# Patient Record
Sex: Female | Born: 1935 | Race: White | Marital: Married | Smoking: Never smoker
Health system: Southern US, Community
[De-identification: ages and names within clinical notes are randomized; demographics above are authoritative.]

## PROBLEM LIST (undated history)

## (undated) DIAGNOSIS — F0280 Dementia in other diseases classified elsewhere without behavioral disturbance: Secondary | ICD-10-CM

## (undated) DIAGNOSIS — F0391 Unspecified dementia with behavioral disturbance: Secondary | ICD-10-CM

## (undated) DIAGNOSIS — E119 Type 2 diabetes mellitus without complications: Secondary | ICD-10-CM

## (undated) DIAGNOSIS — G3183 Dementia with Lewy bodies: Secondary | ICD-10-CM

## (undated) DIAGNOSIS — F419 Anxiety disorder, unspecified: Secondary | ICD-10-CM

## (undated) DIAGNOSIS — I10 Essential (primary) hypertension: Secondary | ICD-10-CM

## (undated) DIAGNOSIS — F03918 Unspecified dementia, unspecified severity, with other behavioral disturbance: Secondary | ICD-10-CM

## (undated) HISTORY — DX: Dementia with Lewy bodies: G31.83

## (undated) HISTORY — DX: Unspecified dementia, unspecified severity, with other behavioral disturbance: F03.918

## (undated) HISTORY — PX: BACK SURGERY: SHX140

## (undated) HISTORY — DX: Anxiety disorder, unspecified: F41.9

## (undated) HISTORY — DX: Dementia in other diseases classified elsewhere, unspecified severity, without behavioral disturbance, psychotic disturbance, mood disturbance, and anxiety: F02.80

## (undated) HISTORY — DX: Type 2 diabetes mellitus without complications: E11.9

## (undated) HISTORY — DX: Essential (primary) hypertension: I10

## (undated) HISTORY — DX: Unspecified dementia with behavioral disturbance: F03.91

---

## 2012-12-29 ENCOUNTER — Other Ambulatory Visit: Payer: Self-pay | Admitting: Neurology

## 2012-12-29 DIAGNOSIS — F0391 Unspecified dementia with behavioral disturbance: Secondary | ICD-10-CM

## 2013-01-08 ENCOUNTER — Ambulatory Visit
Admission: RE | Admit: 2013-01-08 | Discharge: 2013-01-08 | Disposition: A | Discharge status: Home / Self Care | Payer: Self-pay | Source: Ambulatory Visit | Attending: Neurology | Admitting: Neurology

## 2013-01-08 DIAGNOSIS — F0391 Unspecified dementia with behavioral disturbance: Secondary | ICD-10-CM

## 2013-01-08 DIAGNOSIS — F0390 Unspecified dementia without behavioral disturbance: Secondary | ICD-10-CM

## 2013-01-12 ENCOUNTER — Telehealth: Payer: Self-pay | Admitting: *Deleted

## 2013-01-18 ENCOUNTER — Telehealth: Payer: Self-pay | Admitting: Neurology

## 2013-01-18 NOTE — Telephone Encounter (Signed)
I talked to the patient's husband and then the patient's daughter about recent test results. I explained EEG findings which may indicate propensity toward seizures. I also explained the MRI brain findings. Her FTA-abs was negative. She is very sleepy during the day and her husband is having a hard time attending to her. He also injured his back while lifting something at [facility]. I talked to the daughter and explained that for now I would like for her to take the Depakote 250 mg only at night. She was taking it in the morning months. I also want to eliminate the daytime dose of Risperdal and have her take 1.5 mg at night only. She has an appointment next week for a neuropsychological evaluation. The family has talked about the patient and her husband moving into [facility] in the assisted living portion. I suggested that they give [place] an update next week or in the next 2 weeks at which point we may decide to increase the Depakote to 500 mg at night. The patient's husband and the patient's daughter were in agreement with the plan.

## 2013-01-19 NOTE — Telephone Encounter (Signed)
Contact completed

## 2013-02-08 ENCOUNTER — Encounter: Payer: Self-pay | Admitting: *Deleted

## 2013-02-09 DIAGNOSIS — F03918 Unspecified dementia, unspecified severity, with other behavioral disturbance: Secondary | ICD-10-CM

## 2013-02-09 DIAGNOSIS — F0391 Unspecified dementia with behavioral disturbance: Secondary | ICD-10-CM

## 2013-03-30 ENCOUNTER — Ambulatory Visit: Payer: Self-pay | Admitting: Neurology

## 2013-04-15 ENCOUNTER — Encounter: Payer: Self-pay | Admitting: Neurology

## 2013-04-15 ENCOUNTER — Ambulatory Visit (INDEPENDENT_AMBULATORY_CARE_PROVIDER_SITE_OTHER): Payer: Medicare Other | Admitting: Neurology

## 2013-04-15 VITALS — BP 100/59 | HR 88 | Temp 97.2°F | Ht 60.0 in | Wt 145.0 lb

## 2013-04-15 DIAGNOSIS — F03918 Unspecified dementia, unspecified severity, with other behavioral disturbance: Secondary | ICD-10-CM

## 2013-04-15 DIAGNOSIS — F0391 Unspecified dementia with behavioral disturbance: Secondary | ICD-10-CM

## 2013-04-15 DIAGNOSIS — R269 Unspecified abnormalities of gait and mobility: Secondary | ICD-10-CM

## 2013-04-15 HISTORY — DX: Unspecified dementia, unspecified severity, with other behavioral disturbance: F03.918

## 2013-04-15 HISTORY — DX: Unspecified dementia with behavioral disturbance: F03.91

## 2013-04-15 MED ORDER — RISPERIDONE 1 MG PO TABS: 1.5000 mg | ORAL_TABLET | Freq: Every day | ORAL | Status: DC

## 2013-04-15 MED ORDER — DIVALPROEX SODIUM 250 MG PO DR TAB: 500.0000 mg | DELAYED_RELEASE_TABLET | Freq: Every day | ORAL | Status: DC

## 2013-04-15 MED ORDER — DONEPEZIL HCL 10 MG PO TABS: 10.0000 mg | ORAL_TABLET | Freq: Every day | ORAL | Status: DC

## 2013-04-15 NOTE — Progress Notes (Signed)
Subjective:    Patient ID: Monica [name] is a 79 y.o. female.  HPI   Interim history:   Monica [name] is a very pleasant 79 year old right-handed woman who presents for followup consultation of her memory loss with behavior. She's accompanied by her husband and daughter again today. I first met her on 12/28/2012, and which time she was on Aricept with an underlying history of hypertension and mood disturbance. Dermatologic has been ongoing for about 2 years or longer. She has been sleeping a lot during the day. She has had behavioral problems and was hospitalized for those and started on Aricept, Risperdal and Depakote about a year ago while still residing in [place]. She moved from [place] in August 2013. There is no family history of dementia. There is a family history of mood disturbance. At the time of her first visit I felt that them may be underlying loop monitor dementia. I continued her on Aricept and suggested an EEG as well as MRI brain and laboratory testing including B12, RPR, hemoglobin A1c, vitamin D and neuropsychological consultation. Her EEG from 01/04/2013 showed abnormal findings demonstrating moderate diffuse slowing, intermittent triphasic waves and occasional left frontal sharp waves consistent with moderate encephalopathy which can be seen in toxic, metabolic and neuro degenerative processes. She had a positive RPR but FTA abs was neg.  MRI brain from 01/08/13 showed: mild changes of chronic microvascular ischemia and generalized cerebral atrophy.The study is limited by motion artefacts. Vitamin B12 was elevated and she was advised to stop vitamin B12 and her vitamin D was slightly low at 23 and her husband was advised to over-the-counter vitamin D supplement to her regimen. Her daughter reports that she still is very sleepy during the day. She has had no further behavioral escalations. No significant changes reported since last time. She takes Risperdal 1.5 mg at night, Depakote 500 mg  at night, Aricept 10 mg at night. She does not take any medication during the day.  Her Past Medical History Is Significant For: Past Medical History  Diagnosis Date  . Dementia with Lewy bodies   . Dementia, unspecified, with behavioral disturbance   . Diabetes   . HTN (hypertension)   . Anxiety     Her Past Surgical History Is Significant For: No past surgical history on file.  Her Family History Is Significant For: Family History  Problem Relation Age of Onset  . Family history unknown: Yes    Her Social History Is Significant For: History   Social History  . Marital Status: Married    Spouse Name: N/A    Number of Children: N/A  . Years of Education: N/A   Social History Main Topics  . Smoking status: Never Smoker   . Smokeless tobacco: None  . Alcohol Use: No  . Drug Use: No  . Sexually Active: None   Other Topics Concern  . None   Social History Narrative  . None    Her Allergies Are:  Allergies  Allergen Reactions  . Asa (Aspirin)   :   Her Current Medications Are:  Outpatient Encounter Prescriptions as of 04/15/2013  Medication Sig Dispense Refill  . divalproex (DEPAKOTE) 250 MG DR tablet Take 250 mg by mouth at bedtime.       . donepezil (ARICEPT) 10 MG tablet Take 10 mg by mouth at bedtime.      . Multiple Vitamins-Minerals (MULTIVITAMIN PO) Take 1 tablet by mouth daily.      . risperiDONE (RISPERDAL) 1 MG tablet  Take 1.5 mg by mouth at bedtime.       Marland Kitchen VITAMIN D, CHOLECALCIFEROL, PO Take 1 tablet by mouth daily.      . [DISCONTINUED] temazepam (RESTORIL) 15 MG capsule Take 15 mg by mouth at bedtime as needed for sleep.      . [DISCONTINUED] vitamin B-12 (CYANOCOBALAMIN) 500 MCG tablet Take 500 mcg by mouth daily.       No facility-administered encounter medications on file as of 04/15/2013.  : Review of Systems  Constitutional: Positive for fatigue.  Gastrointestinal:       Incontinence  Neurological: Positive for weakness.       Memory  loss, restless leg  Psychiatric/Behavioral: Positive for confusion and dysphoric mood. The patient is nervous/anxious.        Too much sleep    Objective:  Neurologic Exam  Physical Exam Physical Examination:   Filed Vitals:   04/15/13 1209  BP: 100/59  Pulse: 88  Temp: 97.2 F (36.2 C)    General Examination: The patient is a very pleasant 79 y.o. female in no acute distress. She appears well-developed and well-nourished and adequately groomed.   On general exam: She is a frail-appearing elderly lady in no acute distress. She is minimally verbal and not able to provide any history. She denies active hallucinations. HEENT exam: Normocephalic, atraumatic, pupils are equal, round and reactive to light and accommodation. Extraocular tracking is abnormal with saccadic breakdown. She has minimal facial masking. She does not have any significant dysarthria but does have some stuttering and speaks with an accent. Oropharynx is clear with mucosal membranes mildly dry. Neck  tone  seems mildly elevated. She has no lip, neck or jaw tremor. Speech is very scant. Chest is clear to auscultation without wheezing, rhonchi or crackles. Heart sounds are normal without murmurs, rubs or gallops. Abdomen is soft, nontender with normal bowel sounds. She has no pitting edema in the distal lower extremities bilaterally. Skin is warm and dry. Neurologically: Mental status: The patient is awake but pace not very good attention. She is somewhat alert but not fully oriented. She's not answering any questions appropriately and has some confabulation and says she wants to go home. She does not seem to have any active hallucinations. Her Mini-Mental state exam was not repeated today and was about 12/25 in March. Cranial nerve exam is as described above. Motor exam: Thin bulk with fairly normal strength is noted. She has difficulty understanding multistep commands and is able to mimic. Her tone is increased with mild  cogwheeling noted on the right upper extremity only. She has no resting tremor. She has no postural or action tremor. Fine motor skills are impaired bilaterally in the mild to perhaps moderate degree. Reflexes are 1+ throughout. Sensory exam is difficult to tell but seems to be intact to light touch and pinprick. Finger to nose testing shows no additional dysmetria or intention tremor. She has no truncal or gait ataxia but she does walk with a shortened stride length and decrease pace and decreased arm swing bilaterally with a mild to moderate stoop. She walks more insecurely today.   Assessment and Plan:    In summary, Ms. Raphael is a 79 year old lady withA approximately 2-3 year, perhaps 4 year history of progressive dementia with behavioral changes and associated with hallucinations. Her history and physical exam are concerning for dementia with Lewy bodies. Her serum RPR was reactive but FTA Abs was neg. I talked to her family at length about dementia with  behavioral disturbance its prognosis and treatment options.  diagnostically we can consider CSF evaluation especially to check on VDRL. They are in the process of moving to [facility] on August 1 and her husband is scheduled for lower back surgery on July 30. I suggested that we keep her medications the same but consider tapering down her Risperdal and Depakote to reduce sedating medications as much is possible. In light of her upcoming move which can give her a set back because of change of environment I would not like to rock the boat today. We will consider doing a spinal tap down the Road and discuss this again at the next visit.  I will see her back at the end of August. They were in agreement.

## 2013-04-15 NOTE — Patient Instructions (Signed)
I do want to suggest a few things today:  Remember to drink plenty of fluid, eat healthy meals and do not skip any meals. Try to eat protein with a every meal and eat a healthy snack such as fruit or nuts in between meals. Try to keep a regular sleep-wake schedule and try to exercise daily, particularly in the form of walking, 20-30 minutes a day, if you can.   Engage in social activities in your community and with your family and try to keep up with current events by reading the newspaper or watching the news.   As far as your medications are concerned, I would like to suggest no changes, but we may reduce risperdal and depakote next time.    As far as diagnostic testing: we will do blood work today. We may do a spinal tap for spinal fluid testing down the road.   I would like to see you back in 3 months, sooner if we need to. Please call us with any interim questions, concerns, problems, updates or refill requests.  Please also call us for any test results so we can go over those with you on the phone. Brett Canales is my clinical assistant and will answer any of your questions and relay your messages to me and also relay most of my messages to you.  Our phone number is [phone]. We also have an after hours call service for urgent matters and there is a physician on-call for urgent questions. For any emergencies you know to call 911 or go to the nearest emergency room.

## 2013-04-16 ENCOUNTER — Telehealth: Payer: Self-pay

## 2013-04-16 LAB — COMPREHENSIVE METABOLIC PANEL
Albumin: 4.1 g/dL (ref 3.5–4.7)
Alkaline Phosphatase: 76 IU/L (ref 39–117)
BUN/Creatinine Ratio: 14 (ref 11–26)
BUN: 13 mg/dL (ref 8–27)
CO2: 23 mmol/L (ref 18–29)
Calcium: 9.3 mg/dL (ref 8.6–10.2)
Chloride: 104 mmol/L (ref 97–108)
Glucose: 89 mg/dL (ref 65–99)

## 2013-04-16 LAB — CBC WITH DIFFERENTIAL
Eos: 1 % (ref 0–5)
Immature Grans (Abs): 0 10*3/uL (ref 0.0–0.1)
Immature Granulocytes: 0 % (ref 0–2)
Lymphocytes Absolute: 2 10*3/uL (ref 0.7–3.1)
MCV: 93 fL (ref 79–97)
Monocytes: 9 % (ref 4–12)
Neutrophils Relative %: 62 % (ref 40–74)
Platelets: 155 10*3/uL (ref 150–379)
RBC: 4.75 x10E6/uL (ref 3.77–5.28)
WBC: 7 10*3/uL (ref 3.4–10.8)

## 2013-04-16 LAB — AMMONIA: Ammonia: 40 ug/dL (ref 19–87)

## 2013-04-16 NOTE — Telephone Encounter (Signed)
I called and notified patient's husband that patients lab results were normal. He thanked me for the information.

## 2013-04-16 NOTE — Progress Notes (Signed)
Quick Note:  Please notify daughter or husband that patient's labs were unremarkable. ______

## 2013-05-28 ENCOUNTER — Telehealth: Payer: Self-pay | Admitting: Neurology

## 2013-05-28 DIAGNOSIS — F0280 Dementia in other diseases classified elsewhere without behavioral disturbance: Secondary | ICD-10-CM

## 2013-05-31 MED ORDER — QUETIAPINE FUMARATE 25 MG PO TABS: ORAL_TABLET | ORAL | Status: DC

## 2013-05-31 NOTE — Telephone Encounter (Signed)
Daughter says last week patient seemed to be more confused, gait problems increased (unable to find her step), was very tired, and drooling more. Daughter says she stopped giving patient Risperdal this weekend and patient seems to be doing much better. She is requesting an alternative med or milder med to help patient with behavior, especially in the daytime.

## 2013-05-31 NOTE — Telephone Encounter (Signed)
Please call patient's daughter and explained her that we did not make any changes to her mother's medication regimen as she was in the process of moving to assisted living. We did discuss decreasing any sedating medications down the Road. I'm okay with her stopping the Risperdal which was not fond of it in the first place. I would like to suggest a touch of Seroquel which is a quarter of a pill daily as needed during the day for agitation and behavioral problems. Seroquel is also in the family of antipsychotics just as Risperdal but seems to be better tolerated in the elderly. This is a 25 mg strength pill and I would like to go as low as 6.25 mg at this time. I understand it is cumbersome to give quarter pills at a time but I think we should start as low as possible. Let's try this until her next appointment which is less than a month from now.

## 2013-05-31 NOTE — Telephone Encounter (Signed)
Due to messages back and forth, I LM of below, so she knows what the plan is.  LMVM for her to call back also.

## 2013-06-01 NOTE — Telephone Encounter (Signed)
Please have daughter cut the dose of Depakote in half, i.e. 250 mg at night only. It is good that she is there to monitor the patient. We can try this until her next appointment and see if her sedation improves. Depakote is known to cause sedation.

## 2013-06-01 NOTE — Telephone Encounter (Signed)
Daughter, Alona Bene, came by the office and asked questions about her mom.  Since making the change of being off Risperdal, she has noted positive change, no drooling.  Has had agitation.  Does have drowsiness and questions the depakote DR 500mg  qhs dose.  Would this have something to do with the drowsiness.  She seems to wake and be more interactive at 6-7pm, then its time for Depakote again.  She will try the seroquel 1/4 tab and see how she fairs. Has f/u appt 06-29-13, her father is in hospital now and had or is going to have back surgery and then to to rehab.  Daughter is staying with pt at [facility] (independent living).  Please advise on depakote, thanks

## 2013-06-02 NOTE — Telephone Encounter (Signed)
I called and spoke to Monica [name] and I relayed the information to her and she will go ahead and see how she does.  Will keep [place] informed.

## 2013-06-29 ENCOUNTER — Encounter: Payer: Self-pay | Admitting: Neurology

## 2013-06-29 ENCOUNTER — Ambulatory Visit (INDEPENDENT_AMBULATORY_CARE_PROVIDER_SITE_OTHER): Payer: Medicare Other | Admitting: Neurology

## 2013-06-29 VITALS — BP 108/72 | HR 84 | Ht 60.0 in | Wt 143.0 lb

## 2013-06-29 DIAGNOSIS — R451 Restlessness and agitation: Secondary | ICD-10-CM

## 2013-06-29 DIAGNOSIS — F411 Generalized anxiety disorder: Secondary | ICD-10-CM

## 2013-06-29 DIAGNOSIS — R269 Unspecified abnormalities of gait and mobility: Secondary | ICD-10-CM

## 2013-06-29 DIAGNOSIS — R4589 Other symptoms and signs involving emotional state: Secondary | ICD-10-CM

## 2013-06-29 DIAGNOSIS — F0280 Dementia in other diseases classified elsewhere without behavioral disturbance: Secondary | ICD-10-CM

## 2013-06-29 MED ORDER — DIAZEPAM 5 MG PO TABS: 2.5000 mg | ORAL_TABLET | Freq: Every day | ORAL | Status: DC | PRN

## 2013-06-29 NOTE — Progress Notes (Signed)
Subjective:    Patient ID: Monica [name] is a 79 y.o. female.  HPI  Interim history:   Monica [name] is a very pleasant 79 year old right-handed woman who presents for followup consultation of her memory loss with behavioral changes. She's accompanied by her son-in-law and daughter today. I last saw her on 04/15/2013, at which time I felt that she had a physical exam and history concerning for Lewy body dementia. Her husband indicated that they were in the process of moving to [facility] in the beginning of August and her husband was also scheduled for lower back surgery at the end of July. I suggested trying to gradually taper down her sedating medications including Risperdal and Depakote. In the interim, in 8/14, her daughter, Monica [name] called back a few times, reporting excessive daytime sedation and also agitation. We took the patient off of Risperdal and cut the Depakote in half. I also introduced very low dose Seroquel namely 6.25 mg which is a quarter of a pill to see if we can help the agitation. She has been very agitated on the lower dose of VPA and her main issues are agitation, restlessness. She is anxious.  Her daughter has taken her to live with them as patient's husband is in the hospital for the past month for an infection after is surgery.    I first met her on 12/28/2012, and which time she was on Aricept with an underlying history of hypertension and mood disturbance. The memory loss started over 2 years ago. She has been sleeping a lot during the day. She has had behavioral problems and hallucinations and was hospitalized for those and started on Aricept, Risperdal and Depakote about a year ago while still residing in [place]. She moved from [place] in August 2013. There is no family history of dementia. There is a family history of mood disturbance. At the time of her first visit I felt that them may be LBD. I continued her on Aricept and suggested an EEG as well as MRI brain and laboratory  testing including B12, RPR, hemoglobin A1c, vitamin D and neuropsychological consultation. Her EEG from 01/04/2013 showed abnormal findings demonstrating moderate diffuse slowing, intermittent triphasic waves and occasional left frontal sharp waves consistent with moderate encephalopathy which can be seen in toxic, metabolic and neuro degenerative processes. She had a positive RPR but FTA abs was neg.  MRI brain from 01/08/13 showed: This MRI shows mild changes of chronic microvascular ischemia and generalized cerebral atrophy.The study is limited by motion artefacts.  Her Past Medical History Is Significant For: Past Medical History  Diagnosis Date  . Dementia with Lewy bodies   . Dementia, unspecified, with behavioral disturbance   . Diabetes   . HTN (hypertension)   . Anxiety   . Dementia with behavioral disturbance 04/15/2013    Her Past Surgical History Is Significant For: No past surgical history on file.  Her Family History Is Significant For: No family history on file.  Her Social History Is Significant For: History   Social History  . Marital Status: Married    Spouse Name: N/A    Number of Children: N/A  . Years of Education: N/A   Social History Main Topics  . Smoking status: Never Smoker   . Smokeless tobacco: None  . Alcohol Use: No  . Drug Use: No  . Sexual Activity: None   Other Topics Concern  . None   Social History Narrative  . None    Her Allergies Are:  Allergies  Allergen Reactions  . Asa [Aspirin]   :   Her Current Medications Are:  Outpatient Encounter Prescriptions as of 06/29/2013  Medication Sig Dispense Refill  . divalproex (DEPAKOTE) 250 MG DR tablet Take 2 tablets (500 mg total) by mouth at bedtime.  60 tablet  5  . donepezil (ARICEPT) 10 MG tablet Take 1 tablet (10 mg total) by mouth at bedtime.  30 tablet  5  . Multiple Vitamins-Minerals (MULTIVITAMIN PO) Take 1 tablet by mouth daily.      . QUEtiapine (SEROQUEL) 25 MG tablet Take 1/4  pill daily during the day as needed for agitation.  15 tablet  2  . VITAMIN D, CHOLECALCIFEROL, PO Take 1 tablet by mouth daily.      . risperiDONE (RISPERDAL) 1 MG tablet Take 1.5 tablets (1.5 mg total) by mouth at bedtime.  45 tablet  5   No facility-administered encounter medications on file as of 06/29/2013.    Review of Systems  HENT: Positive for trouble swallowing.   Cardiovascular: Positive for palpitations.  Gastrointestinal: Positive for constipation.  Endocrine: Positive for heat intolerance and polydipsia.  Genitourinary: Positive for difficulty urinating.  Neurological: Positive for tremors and speech difficulty.       Memory loss  Psychiatric/Behavioral: Positive for confusion.    Objective:  Neurologic Exam  Physical Exam Physical Examination:   Filed Vitals:   06/29/13 1531  BP: 108/72  Pulse: 84   General Examination: The patient is a very pleasant 79 y.o. female in no acute distress. She appears well-developed and well-nourished and adequately groomed.   On general exam: She is a frail-appearing elderly lady in no acute distress. She is minimally verbal and not able to provide any history. She denies active hallucinations. HEENT exam: Normocephalic, atraumatic, pupils are equal, round and reactive to light and accommodation. Extraocular tracking is abnormal with saccadic breakdown. She has minimal facial masking. She does not have any significant dysarthria but does have some stuttering and speaks with an accent. Oropharynx is clear with mucosal membranes mildly dry. Neck  tone  seems mildly elevated. She has no lip, neck or jaw tremor. Speech is very scant. Chest is clear to auscultation without wheezing, rhonchi or crackles. Heart sounds are normal without murmurs, rubs or gallops. Abdomen is soft, nontender with normal bowel sounds. She has no pitting edema in the distal lower extremities bilaterally. Skin is warm and dry. Neurologically: Mental status: The patient is  awake but pays little attention. She is somewhat alert but not fully oriented. She's not answering any questions appropriately and has some confabulation and she has psychomotor agitation. She does not seem to have any active hallucinations. Her Mini-Mental state exam was not repeated today and was about 12/25 in March. Cranial nerve exam is as described above. Motor exam: Thin bulk with fairly normal strength is noted. She has difficulty understanding multistep commands and is able to mimic. Her tone is increased with mild cogwheeling noted on the right upper extremity only. She has no resting tremor. She has no postural or action tremor. Fine motor skills are impaired bilaterally in the mild to perhaps moderate degree. Reflexes are 1+ throughout. Sensory exam is difficult to tell but seems to be intact to light touch and pinprick. Finger to nose testing shows no additional dysmetria or intention tremor. She has no truncal or gait ataxia but she does walk with a shortened stride length and decrease pace and decreased arm swing bilaterally with a mild to moderate  stoop. She walks more securely today.  Assessment and Plan:   In summary, Monica [name] is a 79 year old lady with an approximately 2-3 year, perhaps 4 year history of progressive dementia with behavioral changes and associated with hallucinations, which were apparent in the beginning. She has lost control of bladder function. Her history and physical exam are concerning for dementia with Lewy bodies. Her serum RPR was reactive but FTA Abs was neg. I talked to her family again at length about dementia with behavioral disturbance its prognosis and treatment options; she has progressed rapidly. She moved to [facility] on August 1 and her husband has been in the hospital with complications of his back surgery. I suggested that we keep her on Depakote 500mg  QHS and that we keep her off of risperdal and increase Seroquel to 12.5 mg. For her anxiety I will  try her on a very small dose of Valium, 2.5 mg prn, but with the understanding, that she may have a paradoxical reaction to it and she is already very sleepy and I reminded her daughter that she could be more sleepy. I will see her back in 3 months. They were in agreement.

## 2013-06-29 NOTE — Patient Instructions (Signed)
Increase Seroquel to 1/2 pill each evening. Continue Depakote ER 500 mg each night and continue Aricept at the current dose. For anxiety and restlessness, let's try low dose valium.

## 2013-07-02 ENCOUNTER — Telehealth: Payer: Self-pay | Admitting: Neurology

## 2013-07-05 ENCOUNTER — Telehealth: Payer: Self-pay | Admitting: Neurology

## 2013-07-06 NOTE — Telephone Encounter (Signed)
Rx resubmitted

## 2013-07-06 NOTE — Telephone Encounter (Signed)
Jessica: Can you resubmit Valium prescription as I entered last time? Thanks. Sandy: Please assist with filling out requested form for memory care - please see below notes.

## 2013-07-07 ENCOUNTER — Emergency Department (HOSPITAL_COMMUNITY): Payer: Medicare Other

## 2013-07-07 ENCOUNTER — Emergency Department (HOSPITAL_COMMUNITY)
Admission: EM | Admit: 2013-07-07 | Discharge: 2013-07-07 | Discharge status: Against Medical Advice | Payer: Medicare Other | Attending: Emergency Medicine | Admitting: Emergency Medicine

## 2013-07-07 ENCOUNTER — Encounter (HOSPITAL_COMMUNITY): Payer: Self-pay | Admitting: Physical Medicine and Rehabilitation

## 2013-07-07 DIAGNOSIS — Y9229 Other specified public building as the place of occurrence of the external cause: Secondary | ICD-10-CM | POA: Insufficient documentation

## 2013-07-07 DIAGNOSIS — IMO0002 Reserved for concepts with insufficient information to code with codable children: Secondary | ICD-10-CM | POA: Insufficient documentation

## 2013-07-07 DIAGNOSIS — R296 Repeated falls: Secondary | ICD-10-CM | POA: Insufficient documentation

## 2013-07-07 DIAGNOSIS — Y939 Activity, unspecified: Secondary | ICD-10-CM | POA: Insufficient documentation

## 2013-07-07 LAB — CBC WITH DIFFERENTIAL/PLATELET
Basophils Absolute: 0 10*3/uL (ref 0.0–0.1)
Eosinophils Absolute: 0.1 10*3/uL (ref 0.0–0.7)
Eosinophils Relative: 1 % (ref 0–5)
MCH: 32.1 pg (ref 26.0–34.0)
MCV: 89.1 fL (ref 78.0–100.0)
Neutrophils Relative %: 56 % (ref 43–77)
Platelets: 182 10*3/uL (ref 150–400)
RDW: 13.3 % (ref 11.5–15.5)
WBC: 8.8 10*3/uL (ref 4.0–10.5)

## 2013-07-07 NOTE — ED Notes (Addendum)
Pt presents to department for evaluation of fall. States patient was in hospital visiting husband and fell on R side. Now states R sided rib/shoulder pain. Abrasion to R forehead. Pt has dementia and is anxious upon arrival to ED. She is alert and can answer simple questions. Respirations unlabored.

## 2013-07-07 NOTE — Telephone Encounter (Signed)
I called and LMVM for Monica [name] to return call re: filling out FML-2 form.  I also called and LMVM for her last evening.  07-06-13.

## 2013-07-07 NOTE — ED Notes (Signed)
Patient vary anxious at this time, patient with dementia.  Patient's daughter came to desk multiple times for a room.  Patient is conscious and alert at this time.

## 2013-07-07 NOTE — ED Notes (Signed)
Patient now complaining of chest pain and shortness of breath.  Patient put in for EKG and vitals done.  Bloodwork drawn.

## 2013-07-08 ENCOUNTER — Encounter (HOSPITAL_BASED_OUTPATIENT_CLINIC_OR_DEPARTMENT_OTHER): Payer: Self-pay

## 2013-07-08 ENCOUNTER — Emergency Department (HOSPITAL_BASED_OUTPATIENT_CLINIC_OR_DEPARTMENT_OTHER)
Admission: EM | Admit: 2013-07-08 | Discharge: 2013-07-08 | Disposition: A | Discharge status: Home / Self Care | Payer: Medicare Other | Attending: Emergency Medicine | Admitting: Emergency Medicine

## 2013-07-08 ENCOUNTER — Emergency Department (HOSPITAL_BASED_OUTPATIENT_CLINIC_OR_DEPARTMENT_OTHER): Payer: Medicare Other

## 2013-07-08 DIAGNOSIS — W1809XA Striking against other object with subsequent fall, initial encounter: Secondary | ICD-10-CM | POA: Insufficient documentation

## 2013-07-08 DIAGNOSIS — Z79899 Other long term (current) drug therapy: Secondary | ICD-10-CM | POA: Insufficient documentation

## 2013-07-08 DIAGNOSIS — F0390 Unspecified dementia without behavioral disturbance: Secondary | ICD-10-CM | POA: Insufficient documentation

## 2013-07-08 DIAGNOSIS — S20219A Contusion of unspecified front wall of thorax, initial encounter: Secondary | ICD-10-CM | POA: Insufficient documentation

## 2013-07-08 DIAGNOSIS — F411 Generalized anxiety disorder: Secondary | ICD-10-CM | POA: Insufficient documentation

## 2013-07-08 DIAGNOSIS — Y9210 Unspecified residential institution as the place of occurrence of the external cause: Secondary | ICD-10-CM | POA: Insufficient documentation

## 2013-07-08 DIAGNOSIS — Y9389 Activity, other specified: Secondary | ICD-10-CM | POA: Insufficient documentation

## 2013-07-08 DIAGNOSIS — E119 Type 2 diabetes mellitus without complications: Secondary | ICD-10-CM | POA: Insufficient documentation

## 2013-07-08 DIAGNOSIS — IMO0002 Reserved for concepts with insufficient information to code with codable children: Secondary | ICD-10-CM | POA: Insufficient documentation

## 2013-07-08 DIAGNOSIS — S20211A Contusion of right front wall of thorax, initial encounter: Secondary | ICD-10-CM

## 2013-07-08 DIAGNOSIS — I10 Essential (primary) hypertension: Secondary | ICD-10-CM | POA: Insufficient documentation

## 2013-07-08 LAB — POCT I-STAT TROPONIN I: Troponin i, poc: 0 ng/mL (ref 0.00–0.08)

## 2013-07-08 LAB — POCT I-STAT, CHEM 8
Calcium, Ion: 1.1 mmol/L — ABNORMAL LOW (ref 1.13–1.30)
Glucose, Bld: 98 mg/dL (ref 70–99)
HCT: 43 % (ref 36.0–46.0)
Hemoglobin: 14.6 g/dL (ref 12.0–15.0)
TCO2: 25 mmol/L (ref 0–100)

## 2013-07-08 MED ORDER — TRAMADOL HCL 50 MG PO TABS
ORAL_TABLET | ORAL | Status: AC
  Filled 2013-07-08: qty 1

## 2013-07-08 MED ORDER — TRAMADOL HCL 50 MG PO TABS: 50.0000 mg | ORAL_TABLET | Freq: Four times a day (QID) | ORAL | Status: DC | PRN

## 2013-07-08 MED ORDER — TRAMADOL HCL 50 MG PO TABS
50.0000 mg | ORAL_TABLET | Freq: Once | ORAL | Status: AC
  Administered 2013-07-08: 50 mg via ORAL
  Filled 2013-07-08: qty 1

## 2013-07-08 NOTE — ED Provider Notes (Addendum)
CSN: [id number]     Arrival date & time 07/08/13  1551 History   First MD Initiated Contact with Patient 07/08/13 1631     Chief Complaint  Patient presents with  . Fall   (Consider location/radiation/quality/duration/timing/severity/associated sxs/prior Treatment) HPI Comments: Patient presents with rib pain after fall. She was in the [facility] emergency department last night waiting for her daughter who lives with the patient's has been in the ED. While the patient was sitting in a wheelchair in the emergency department lobby her foot got caught in the wheelchair and she fell over onto the floor. She's been complaining of pain in her right ribs. She did hit her head and there was a small abrasion to the face. Her daughter thinks abrasions from where her glasses scratched her. Her daughter thinks that she is at her baseline mental status today. She's had no vomiting. She hasn't had any other apparent complaints of pain. She does have some underlying dementia so history is limited. She's been ambulating with no apparent difficulty with ambulation or pain in her legs. She was initially checked into the emergency department last night and they did an x-ray of her chest in her right shoulder. However there was a long wait in the ED and the patient was getting agitated so her daughter took her home before she was evaluated.  Patient is a 79 y.o. female presenting with fall.  Fall    Past Medical History  Diagnosis Date  . Dementia with Lewy bodies   . Dementia, unspecified, with behavioral disturbance   . Diabetes   . HTN (hypertension)   . Anxiety   . Dementia with behavioral disturbance 04/15/2013   Past Surgical History  Procedure Laterality Date  . Back surgery     No family history on file. History  Substance Use Topics  . Smoking status: Never Smoker   . Smokeless tobacco: Not on file  . Alcohol Use: No   OB History   Grav Para Term Preterm Abortions TAB SAB Ect Mult Living               Review of Systems  Unable to perform ROS: Dementia    Allergies  Asa  Home Medications   Current Outpatient Rx  Name  Route  Sig  Dispense  Refill  . diazepam (VALIUM) 5 MG tablet   Oral   Take 0.5 tablets (2.5 mg total) by mouth daily as needed for anxiety.   15 tablet   2   . divalproex (DEPAKOTE) 250 MG DR tablet   Oral   Take 2 tablets (500 mg total) by mouth at bedtime.   60 tablet   5   . donepezil (ARICEPT) 10 MG tablet   Oral   Take 1 tablet (10 mg total) by mouth at bedtime.   30 tablet   5   . Multiple Vitamins-Minerals (MULTIVITAMIN PO)   Oral   Take 1 tablet by mouth daily.         . QUEtiapine (SEROQUEL) 25 MG tablet      Take 1/4 pill daily during the day as needed for agitation.   15 tablet   2   . traMADol (ULTRAM) 50 MG tablet   Oral   Take 1 tablet (50 mg total) by mouth every 6 (six) hours as needed for pain.   15 tablet   0   . VITAMIN D, CHOLECALCIFEROL, PO   Oral   Take 1 tablet by mouth daily.  BP 114/80  Pulse 69  Temp(Src) 98.1 F (36.7 C) (Oral)  Resp 18  Ht 5\' 1"  (1.549 m)  Wt 144 lb (65.318 kg)  BMI 27.22 kg/m2  SpO2 97% Physical Exam  Constitutional: She appears well-developed and well-nourished.  HENT:  Head: Normocephalic.  Small abrasion just lateral to the right eyebrow. No underlying bony tenderness to the face.  Eyes: Pupils are equal, round, and reactive to light.  Neck: Normal range of motion. Neck supple.  No apparent tenderness to the cervical thoracic or lumbar sacral spine.  Cardiovascular: Normal rate, regular rhythm and normal heart sounds.   Pulmonary/Chest: Effort normal and breath sounds normal. No respiratory distress. She has no wheezes. She has no rales. She exhibits tenderness.  Positive tenderness to the right lower ribs. It's tender in the anterior and lateral aspect of the ribs. There is no crepitus or underlying deformity noted. No ecchymosis to the chest wall or the  abdominal wall is noted.  Abdominal: Soft. Bowel sounds are normal. There is no tenderness. There is no rebound and no guarding.  No pain around the liver the spleen.  Musculoskeletal: Normal range of motion. She exhibits no edema.  She has no pain on palpation or range of motion of the extremities, including the hips. There is a small area of ecchymosis adjacent to the right elbow but there is no underlying bony tenderness.  Lymphadenopathy:    She has no cervical adenopathy.  Neurological: She is alert.  Patient answers questions but is confused. She is at her baseline mental status per her daughter. She moves all extremities symmetrically.  Skin: Skin is warm and dry. No rash noted.  Psychiatric: She has a normal mood and affect.    ED Course  Procedures (including critical care time) Labs Review Labs Reviewed - No data to display Imaging Review Dg Chest 2 View  07/08/2013   CLINICAL DATA:  History of fall from wheelchair. Lateral chest wall pain.  EXAM: CHEST  2 VIEW  COMPARISON:  CHEST x-ray 07/07/2013.  FINDINGS: Lung volumes appear slightly low. Diffuse interstitial prominence throughout the periphery of the lungs bilaterally, most severe in the lower lungs, concerning for an underlying interstitial lung disease. No definite pleural effusions. No evidence of pulmonary edema. Potential bronchiectasis in the right lower lobe. Heart size is normal. Eventration of the right hemidiaphragm. Mediastinal contours are unremarkable. Atherosclerosis in the thoracic aorta. No definite acute displaced fractures of the visualized portions of the thorax. Surgical clips project over the right upper quadrant of the abdomen, likely from prior cholecystectomy.  IMPRESSION: 1. The appearance of the chest suggests underlying interstitial lung disease. This could be better evaluated with followup high-resolution Chest CT if clinically indicated. 2. No signs of significant acute traumatic injury to the thorax. 3.  Atherosclerosis.   Electronically Signed   By: Trudie Reed M.D.   On: 07/08/2013 17:51   Dg Chest 2 View  07/07/2013   CLINICAL DATA:  Right sided pain post fall.  EXAM: CHEST  2 VIEW  COMPARISON:  None.  FINDINGS: No pneumothorax or effusion. Coarse interstitial and airspace opacities in the basilar segments of both lower lobes. Coarse perihilar interstitial markings of uncertain chronicity. No definite effusion. Mild cardiomegaly. Tortuous thoracic aorta. Curvilinear right paratracheal density probably tortuous brachiocephalic vessels but nonspecific. Surgical clips in the right upper abdomen. Regional bones grossly unremarkable.  IMPRESSION: Mild cardiomegaly  Perihilar and bibasilar interstitial opacities of uncertain chronicity.  No definite acute abnormality.   Electronically  Signed   By: Oley Balm M.D.   On: 07/07/2013 20:05   Dg Shoulder Right  07/07/2013   CLINICAL DATA:  Right chest pain post fall  EXAM: RIGHT SHOULDER - 2+ VIEW  COMPARISON:  None.  FINDINGS: Mild diffuse osteopenia. Small marginal spurs from the humeral head. Negative for fracture or dislocation.  IMPRESSION: No fracture or other acute abnormality.  Osteopenia and early degenerative spurring   Electronically Signed   By: Oley Balm M.D.   On: 07/07/2013 20:06    MDM   1. Rib contusion, right, initial encounter    Patient had no evidence of pneumothorax. I did repeat a chest x-ray today because the daughter feels like the patient has been painting and has been having more shortness of breath today. In the room she has no evidence of increased work of breathing. There is no evidence of pneumothorax. There is a likely rib fracture versus contusion. There's no associated abdominal tenderness. She does have a small abrasion on the forehead. There is no hematomas. I discussed with the daughter doing a head CT given her age and difficulty with assessment given her dementia. At this point the daughter does not want to  do this and will observe her at home for any changes. She was given a dose of Ultram here in the ED and given a prescription for Ultram for pain management. The daughter did not want to give her any stronger pain medications. I encouraged her to make an appointment to followup with her primary care provider or return here as needed if her symptoms worsen. There is evidence on chest x-ray of some possible underlying interstitial lung disease. Currently the patient has no hypoxia, increased work of breathing over tachypnea. I did notify the daughter these findings but I did not feel any further workup is needed at this point.   Rolan Bucco, MD 07/08/13 4098  Rolan Bucco, MD 07/08/13 1850

## 2013-07-08 NOTE — ED Notes (Signed)
Fall out of w/c yesterday in [place]-pt was seen in ED for same but left w/o being seen after 4 hrs-per daughter-c/o pain to right side-ambulated to triage room with daughter at side for assist

## 2013-07-08 NOTE — ED Notes (Signed)
Had reaction to po medication:  Diaphoresis, shaking.  Pt skin warm and dry currently.  Gave po juice as pt has not been eating well.  Will continue to monitor.

## 2013-07-12 NOTE — Telephone Encounter (Signed)
Daughter, Alona Bene returned call on Friday.  Her father in hospital, and she is trying to juggle both her parents care.  Her mother at this point is staying with her, since she is independent living at [facility] (this for the last 6 wks.).  They are trying to move her to memory care unit.   Needs FL2 form and possible another form.  She was not sure what.   I filled out FL-2 form and will fax/email to her husband the ROI for [facility].

## 2013-07-16 ENCOUNTER — Telehealth: Payer: Self-pay | Admitting: *Deleted

## 2013-07-16 NOTE — Telephone Encounter (Signed)
Please have Andrey Campanile give [place] a call

## 2013-07-19 NOTE — Telephone Encounter (Signed)
I called and spoke to daughter of pt and FL-2 to be signed by Dr. Frances Furbish.  In her office.

## 2013-07-22 ENCOUNTER — Telehealth: Payer: Self-pay | Admitting: Neurology

## 2013-08-04 ENCOUNTER — Telehealth: Payer: Self-pay | Admitting: *Deleted

## 2013-08-04 NOTE — Telephone Encounter (Signed)
I faxed the [facility] [employer] forms relating to supplemental orders: diet, vaccinations, exercise. (noting to them that she needs to have pcp fill out).  I did call and LMVM yesterday also.

## 2013-08-05 ENCOUNTER — Other Ambulatory Visit: Payer: Self-pay

## 2013-08-05 DIAGNOSIS — F0280 Dementia in other diseases classified elsewhere without behavioral disturbance: Secondary | ICD-10-CM

## 2013-08-05 MED ORDER — QUETIAPINE FUMARATE 25 MG PO TABS: 12.5000 mg | ORAL_TABLET | Freq: Every day | ORAL | Status: DC

## 2013-08-05 NOTE — Telephone Encounter (Signed)
Last OV Note Says: increase Seroquel to 12.5 mg

## 2013-08-18 ENCOUNTER — Telehealth: Payer: Self-pay | Admitting: *Deleted

## 2013-08-18 NOTE — Telephone Encounter (Signed)
Concerning the Fl-2 and the medications on the form.  I called and LMVM for her to return call.

## 2013-08-20 NOTE — Telephone Encounter (Signed)
I spoke to [facility] and answered her questions about Vit D (amount) she would check with facility, Motrin 1or 2 tabs (she will check with family thru facility) and Valium is for anxiety prn.

## 2013-09-26 ENCOUNTER — Other Ambulatory Visit: Payer: Self-pay

## 2013-09-26 DIAGNOSIS — R269 Unspecified abnormalities of gait and mobility: Secondary | ICD-10-CM

## 2013-09-26 DIAGNOSIS — F0391 Unspecified dementia with behavioral disturbance: Secondary | ICD-10-CM

## 2013-09-26 DIAGNOSIS — F0280 Dementia in other diseases classified elsewhere without behavioral disturbance: Secondary | ICD-10-CM

## 2013-09-26 MED ORDER — DONEPEZIL HCL 10 MG PO TABS: 10.0000 mg | ORAL_TABLET | Freq: Every day | ORAL | Status: DC

## 2013-09-26 MED ORDER — DIVALPROEX SODIUM 250 MG PO DR TAB: 500.0000 mg | DELAYED_RELEASE_TABLET | Freq: Every day | ORAL | Status: DC

## 2013-09-26 MED ORDER — QUETIAPINE FUMARATE 25 MG PO TABS: 12.5000 mg | ORAL_TABLET | Freq: Every day | ORAL | Status: DC

## 2013-09-30 ENCOUNTER — Other Ambulatory Visit: Payer: Self-pay

## 2013-09-30 DIAGNOSIS — F411 Generalized anxiety disorder: Secondary | ICD-10-CM

## 2013-09-30 MED ORDER — DIAZEPAM 5 MG PO TABS: 2.5000 mg | ORAL_TABLET | Freq: Every day | ORAL | Status: DC | PRN

## 2013-10-01 NOTE — Telephone Encounter (Signed)
Rx [facility]ed to [facility] at [phone].

## 2013-10-09 ENCOUNTER — Other Ambulatory Visit: Payer: Self-pay | Admitting: Neurology

## 2013-12-06 ENCOUNTER — Other Ambulatory Visit: Payer: Self-pay | Admitting: Neurology

## 2014-02-16 ENCOUNTER — Other Ambulatory Visit: Payer: Self-pay | Admitting: Neurology

## 2014-03-13 ENCOUNTER — Encounter (HOSPITAL_COMMUNITY): Payer: Self-pay | Admitting: Emergency Medicine

## 2014-03-13 ENCOUNTER — Emergency Department (HOSPITAL_COMMUNITY)
Admission: EM | Admit: 2014-03-13 | Discharge: 2014-03-13 | Disposition: A | Discharge status: Home / Self Care | Payer: Medicare Other | Attending: Emergency Medicine | Admitting: Emergency Medicine

## 2014-03-13 ENCOUNTER — Emergency Department (HOSPITAL_COMMUNITY): Payer: Medicare Other

## 2014-03-13 DIAGNOSIS — F03918 Unspecified dementia, unspecified severity, with other behavioral disturbance: Secondary | ICD-10-CM | POA: Insufficient documentation

## 2014-03-13 DIAGNOSIS — F0390 Unspecified dementia without behavioral disturbance: Secondary | ICD-10-CM

## 2014-03-13 DIAGNOSIS — F411 Generalized anxiety disorder: Secondary | ICD-10-CM | POA: Insufficient documentation

## 2014-03-13 DIAGNOSIS — F0280 Dementia in other diseases classified elsewhere without behavioral disturbance: Secondary | ICD-10-CM | POA: Insufficient documentation

## 2014-03-13 DIAGNOSIS — Z79899 Other long term (current) drug therapy: Secondary | ICD-10-CM | POA: Insufficient documentation

## 2014-03-13 DIAGNOSIS — F0391 Unspecified dementia with behavioral disturbance: Secondary | ICD-10-CM | POA: Insufficient documentation

## 2014-03-13 DIAGNOSIS — E119 Type 2 diabetes mellitus without complications: Secondary | ICD-10-CM | POA: Insufficient documentation

## 2014-03-13 DIAGNOSIS — G3183 Dementia with Lewy bodies: Secondary | ICD-10-CM

## 2014-03-13 DIAGNOSIS — R4182 Altered mental status, unspecified: Secondary | ICD-10-CM | POA: Insufficient documentation

## 2014-03-13 DIAGNOSIS — I10 Essential (primary) hypertension: Secondary | ICD-10-CM | POA: Insufficient documentation

## 2014-03-13 LAB — BASIC METABOLIC PANEL
BUN: 15 mg/dL (ref 6–23)
CALCIUM: 8.6 mg/dL (ref 8.4–10.5)
CO2: 29 meq/L (ref 19–32)
Chloride: 103 mEq/L (ref 96–112)
Creatinine, Ser: 0.78 mg/dL (ref 0.50–1.10)
GFR calc Af Amer: 87 mL/min — ABNORMAL LOW (ref >90)
GFR, EST NON AFRICAN AMERICAN: 75 mL/min — AB (ref >90)
GLUCOSE: 80 mg/dL (ref 70–99)
Potassium: 4.7 mEq/L (ref 3.7–5.3)
Sodium: 141 mEq/L (ref 137–147)

## 2014-03-13 LAB — URINALYSIS, ROUTINE W REFLEX MICROSCOPIC
BILIRUBIN URINE: NEGATIVE
Glucose, UA: NEGATIVE mg/dL
HGB URINE DIPSTICK: NEGATIVE
Ketones, ur: NEGATIVE mg/dL
Leukocytes, UA: NEGATIVE
NITRITE: NEGATIVE
PH: 7 (ref 5.0–8.0)
Protein, ur: NEGATIVE mg/dL
SPECIFIC GRAVITY, URINE: 1.006 (ref 1.005–1.030)
UROBILINOGEN UA: 0.2 mg/dL (ref 0.0–1.0)

## 2014-03-13 LAB — CBC WITH DIFFERENTIAL/PLATELET
Basophils Absolute: 0 10*3/uL (ref 0.0–0.1)
Basophils Relative: 0 % (ref 0–1)
EOS ABS: 0.1 10*3/uL (ref 0.0–0.7)
EOS PCT: 2 % (ref 0–5)
HEMATOCRIT: 37.9 % (ref 36.0–46.0)
HEMOGLOBIN: 12.7 g/dL (ref 12.0–15.0)
LYMPHS ABS: 2.1 10*3/uL (ref 0.7–4.0)
LYMPHS PCT: 38 % (ref 12–46)
MCH: 30.9 pg (ref 26.0–34.0)
MCHC: 33.5 g/dL (ref 30.0–36.0)
MCV: 92.2 fL (ref 78.0–100.0)
MONOS PCT: 11 % (ref 3–12)
Monocytes Absolute: 0.6 10*3/uL (ref 0.1–1.0)
Neutro Abs: 2.7 10*3/uL (ref 1.7–7.7)
Neutrophils Relative %: 49 % (ref 43–77)
Platelets: 140 10*3/uL — ABNORMAL LOW (ref 150–400)
RBC: 4.11 MIL/uL (ref 3.87–5.11)
RDW: 13.9 % (ref 11.5–15.5)
WBC: 5.5 10*3/uL (ref 4.0–10.5)

## 2014-03-13 LAB — I-STAT TROPONIN, ED: TROPONIN I, POC: 0 ng/mL (ref 0.00–0.08)

## 2014-03-13 LAB — I-STAT CG4 LACTIC ACID, ED: Lactic Acid, Venous: 1.18 mmol/L (ref 0.5–2.2)

## 2014-03-13 NOTE — ED Provider Notes (Signed)
CSN: [id number]     Arrival date & time 03/13/14  1713 History   First MD Initiated Contact with Patient 03/13/14 1720     Chief Complaint  Patient presents with  . Altered Mental Status     (Consider location/radiation/quality/duration/timing/severity/associated sxs/prior Treatment) Patient is a 80 y.o. female presenting with altered mental status. The history is provided by the spouse, the nursing home, medical records and the EMS personnel.  Altered Mental Status   LEVEL 5 CAVEAT:  AMS, DEMENTIA This is an 80 y.o. F with PMH of HTN, DM, anxiety, advanced dementia, presenting to the ED from [facility] SNF for AMS.  Pt is a resident there, lives in same room with her husband.  Staff reports that she seemed to have some pain while urinating (she appeared to be wincing) but denies foul odor or discoloration of urine.  Pt did have episode of incontinence today.  Husband states they were moved to a different room in the facility approx 1 month ago and pt has not been acting right since.  No reported falls, head injuries, fever, chills, or recent illness.  VS stable on arrival.  Past Medical History  Diagnosis Date  . Dementia with Lewy bodies   . Dementia, unspecified, with behavioral disturbance   . Diabetes   . HTN (hypertension)   . Anxiety   . Dementia with behavioral disturbance 04/15/2013   Past Surgical History  Procedure Laterality Date  . Back surgery     No family history on file. History  Substance Use Topics  . Smoking status: Never Smoker   . Smokeless tobacco: Not on file  . Alcohol Use: No   OB History   Grav Para Term Preterm Abortions TAB SAB Ect Mult Living                 Review of Systems  Unable to perform ROS: Dementia      Allergies  Asa  Home Medications   Prior to Admission medications   Medication Sig Start Date End Date Taking? Authorizing Provider  donepezil (ARICEPT) 10 MG tablet Take 10 mg by mouth at bedtime.   Yes Historical  Provider, MD  diazepam (VALIUM) 5 MG tablet Take 0.5 tablets (2.5 mg total) by mouth daily as needed for anxiety. 09/30/13   Huston Foley, MD  divalproex (DEPAKOTE) 250 MG DR tablet Take 2 tablets (500 mg total) by mouth at bedtime. 09/26/13   Huston Foley, MD  Multiple Vitamins-Minerals (MULTIVITAMIN PO) Take 1 tablet by mouth daily.    Historical Provider, MD  QUEtiapine (SEROQUEL) 25 MG tablet Take 0.5 tablets (12.5 mg total) by mouth daily. As needed for agitation. 09/26/13   Huston Foley, MD  VITAMIN D, CHOLECALCIFEROL, PO Take 1 tablet by mouth daily.    Historical Provider, MD   BP 127/72  Pulse 67  Temp(Src) 97.6 F (36.4 C) (Oral)  Resp 16  SpO2 98%  Physical Exam  Nursing note and vitals reviewed. Constitutional: Vital signs are normal. She appears well-developed and well-nourished. No distress.  HENT:  Head: Normocephalic and atraumatic.  Mouth/Throat: Oropharynx is clear and moist.  Eyes: Conjunctivae and EOM are normal. Pupils are equal, round, and reactive to light.  Neck: Normal range of motion. Neck supple.  Cardiovascular: Normal rate, regular rhythm and normal heart sounds.   Pulmonary/Chest: Effort normal and breath sounds normal. No respiratory distress. She has no wheezes.  Abdominal: Soft. Bowel sounds are normal. There is no tenderness. There is no guarding.  Musculoskeletal: Normal range of motion.  Neurological: She is alert.  Awake, alert; oriented to self and place only; CN grossly intact; following commands and moving extremities when prompted; no signs of ataxia; no facial asymmetry noted  Skin: Skin is warm and dry. She is not diaphoretic.  Psychiatric: She has a normal mood and affect.    ED Course  Procedures (including critical care time) Labs Review Labs Reviewed  CBC WITH DIFFERENTIAL - Abnormal; Notable for the following:    Platelets 140 (*)    All other components within normal limits  BASIC METABOLIC PANEL - Abnormal; Notable for the following:     GFR calc non Af Amer 75 (*)    GFR calc Af Amer 87 (*)    All other components within normal limits  URINE CULTURE  URINALYSIS, ROUTINE W REFLEX MICROSCOPIC  I-STAT CG4 LACTIC ACID, ED  Rosezena Sensor, ED    Imaging Review Dg Chest 2 View  03/13/2014   CLINICAL DATA:  Altered mental status.  Hypertension.  EXAM: CHEST  2 VIEW  COMPARISON:  07/08/2013  FINDINGS: Cardiac silhouette is mildly enlarged. No mediastinal or hilar masses. There is bilateral irregular interstitial thickening similar to the prior exam allowing for differences in patient positioning and technique. No lung consolidation or overt pulmonary edema. No pleural effusion or pneumothorax.  Bony thorax is demineralized but grossly intact.  IMPRESSION: No acute cardiopulmonary disease.   Electronically Signed   By: Amie Portland M.D.   On: 03/13/2014 18:22   Ct Head Wo Contrast  03/13/2014   CLINICAL DATA:  Altered mental status.  Nursing home patient.  EXAM: CT HEAD WITHOUT CONTRAST  TECHNIQUE: Contiguous axial images were obtained from the base of the skull through the vertex without contrast.  COMPARISON:  Brain MRI January 08, 2013  FINDINGS: Generalized atrophy. Small vessel disease. No evidence for acute infarction, hemorrhage, mass lesion, hydrocephalus, or extra-axial fluid. Calvarium intact. Advanced vascular calcification. No acute sinus, mastoid, or orbital abnormality.  IMPRESSION: No acute intracranial abnormality. Generalized atrophy and small vessel disease similar to priors.   Electronically Signed   By: Davonna Belling M.D.   On: 03/13/2014 18:24     EKG Interpretation   Date/Time:  Sunday Mar 13 2014 17:49:49 EDT Ventricular Rate:  63 PR Interval:  228 QRS Duration: 82 QT Interval:  421 QTC Calculation: 431 R Axis:   26 Text Interpretation:  Sinus rhythm Prolonged PR interval Borderline  repolarization abnormality Baseline wander in lead(s) III V3 No  significant change was found Confirmed by CAMPOS  MD,  Caryn Bee ([id number]) on  03/13/2014 5:55:18 PM      MDM   Final diagnoses:  Dementia  Altered mental status   80 y.o. F with advanced dementia presenting to the ED for AMS.  Per husband, pt has not been acting right for the past month since they changed her room at the facility.  Pt has no current complaints and cannot tell me why she is here.  Her neuro exam is non-focal.  Will initiate work-up including CT head, cxr, EKG, CBC, BMP, u/a, lactic acid, trop.  ekg sinus rhythm without ischemic change.  Trop negative.  CT head and CXR WNL.  Labs reassuring.  U/a without infection.  Work-up essentially benign, nothing to explain pts AMS.  Possibility adjustment problem related to her SNF room change +/- advancing dementia.  Will discharge back to facility.  Continue home meds.  FU with PCP.  Discussed plan with patient, he/she  acknowledged understanding and agreed with plan of care.  Return precautions given for new or worsening symptoms.  Garlon HatchetLisa M Delquan Poucher, PA-C 03/13/14 2352

## 2014-03-13 NOTE — ED Notes (Signed)
Pt from [facility] via EMS- per EMS staff called c/o AMS. Pt confused, agitated and combative. Staff reports that pt may have had pain while urinating and pt thinks that she has "wet herself." Pt and husband reside together at [facility] and husband reports that they changed rooms approx 1 month ago and pt has not acted right since the move. Pt has advance dementia. Pt is in NAD

## 2014-03-13 NOTE — ED Notes (Signed)
[facility] called  

## 2014-03-13 NOTE — ED Notes (Signed)
Pt in CT.

## 2014-03-13 NOTE — Discharge Instructions (Signed)
Work-up today was normal. Return to the ED for new concerns or worsening symptoms.

## 2014-03-13 NOTE — ED Notes (Signed)
Bed: EH20 Expected date:  Expected time:  Means of arrival:  Comments: EMS/urinary s/s

## 2014-03-13 NOTE — ED Notes (Signed)
Patient is alert and oriented x3.  She was given DC instructions and follow up visit instructions.  Patient gave verbal understanding. She was DC ambulatory under her own power to home.  V/S stable.  He was not showing any signs of distress on DC 

## 2014-03-13 NOTE — ED Notes (Signed)
Pt from [facility] has hx of advanced dementia, unable to voice why she is here but is A&O to self and place, but unable to state DOB, age or year. Pt denies pain, has bilateral clear lung sounds, skin warm and dry. Pt in NAD.

## 2014-03-14 LAB — URINE CULTURE
COLONY COUNT: NO GROWTH
CULTURE: NO GROWTH

## 2014-03-14 NOTE — ED Provider Notes (Signed)
Medical screening examination/treatment/procedure(s) were conducted as a shared visit with non-physician practitioner(s) and myself.  I personally evaluated the patient during the encounter.   EKG Interpretation   Date/Time:  Sunday Mar 13 2014 17:49:49 EDT Ventricular Rate:  63 PR Interval:  228 QRS Duration: 82 QT Interval:  421 QTC Calculation: 431 R Axis:   26 Text Interpretation:  Sinus rhythm Prolonged PR interval Borderline  repolarization abnormality Baseline wander in lead(s) III V3 No  significant change was found Confirmed by CAMPOS  MD, Caryn Bee ([id number]) on  03/13/2014 5:55:18 PM      I interviewed and examined the patient. Lungs are CTAB. Cardiac exam wnl. Abdomen soft.  I interpreted/reviewed the labs and/or imaging which were non-contributory.  Pt demented at baseline. Change in MS likely related to SNF room change vs advancing dementia.   Junius Argyle, MD 03/14/14 1220

## 2014-07-08 ENCOUNTER — Ambulatory Visit: Payer: Medicare Other

## 2014-07-08 ENCOUNTER — Other Ambulatory Visit: Payer: Self-pay | Admitting: Family Medicine

## 2014-07-08 DIAGNOSIS — M7989 Other specified soft tissue disorders: Secondary | ICD-10-CM

## 2014-08-06 ENCOUNTER — Encounter (HOSPITAL_COMMUNITY): Payer: Self-pay | Admitting: Emergency Medicine

## 2014-08-06 ENCOUNTER — Inpatient Hospital Stay (HOSPITAL_COMMUNITY)
Admission: EM | Admit: 2014-08-06 | Discharge: 2014-08-21 | DRG: 023 | Disposition: E | Payer: Medicare Other | Attending: Neurology | Admitting: Neurology

## 2014-08-06 ENCOUNTER — Encounter (HOSPITAL_COMMUNITY): Admission: EM | Disposition: E | Payer: Self-pay | Source: Home / Self Care | Attending: Neurology

## 2014-08-06 ENCOUNTER — Emergency Department (HOSPITAL_COMMUNITY): Payer: Medicare Other

## 2014-08-06 ENCOUNTER — Encounter (HOSPITAL_COMMUNITY): Payer: Medicare Other | Admitting: Anesthesiology

## 2014-08-06 ENCOUNTER — Inpatient Hospital Stay: Admit: 2014-08-06 | Payer: Self-pay | Admitting: Interventional Radiology

## 2014-08-06 ENCOUNTER — Inpatient Hospital Stay (HOSPITAL_COMMUNITY): Payer: Medicare Other | Admitting: Anesthesiology

## 2014-08-06 DIAGNOSIS — R001 Bradycardia, unspecified: Secondary | ICD-10-CM | POA: Diagnosis present

## 2014-08-06 DIAGNOSIS — I63411 Cerebral infarction due to embolism of right middle cerebral artery: Secondary | ICD-10-CM | POA: Diagnosis present

## 2014-08-06 DIAGNOSIS — R4182 Altered mental status, unspecified: Secondary | ICD-10-CM | POA: Diagnosis present

## 2014-08-06 DIAGNOSIS — Z66 Do not resuscitate: Secondary | ICD-10-CM | POA: Diagnosis present

## 2014-08-06 DIAGNOSIS — I739 Peripheral vascular disease, unspecified: Secondary | ICD-10-CM | POA: Diagnosis present

## 2014-08-06 DIAGNOSIS — R414 Neurologic neglect syndrome: Secondary | ICD-10-CM | POA: Diagnosis present

## 2014-08-06 DIAGNOSIS — I63131 Cerebral infarction due to embolism of right carotid artery: Secondary | ICD-10-CM

## 2014-08-06 DIAGNOSIS — G3183 Dementia with Lewy bodies: Secondary | ICD-10-CM | POA: Diagnosis present

## 2014-08-06 DIAGNOSIS — I480 Paroxysmal atrial fibrillation: Secondary | ICD-10-CM | POA: Diagnosis present

## 2014-08-06 DIAGNOSIS — Z515 Encounter for palliative care: Secondary | ICD-10-CM | POA: Diagnosis not present

## 2014-08-06 DIAGNOSIS — I959 Hypotension, unspecified: Secondary | ICD-10-CM | POA: Diagnosis present

## 2014-08-06 DIAGNOSIS — F0281 Dementia in other diseases classified elsewhere with behavioral disturbance: Secondary | ICD-10-CM | POA: Diagnosis present

## 2014-08-06 DIAGNOSIS — E119 Type 2 diabetes mellitus without complications: Secondary | ICD-10-CM | POA: Diagnosis present

## 2014-08-06 DIAGNOSIS — R609 Edema, unspecified: Secondary | ICD-10-CM | POA: Diagnosis present

## 2014-08-06 DIAGNOSIS — F03918 Unspecified dementia, unspecified severity, with other behavioral disturbance: Secondary | ICD-10-CM

## 2014-08-06 DIAGNOSIS — G936 Cerebral edema: Secondary | ICD-10-CM | POA: Diagnosis present

## 2014-08-06 DIAGNOSIS — G935 Compression of brain: Secondary | ICD-10-CM | POA: Diagnosis present

## 2014-08-06 DIAGNOSIS — F419 Anxiety disorder, unspecified: Secondary | ICD-10-CM | POA: Diagnosis present

## 2014-08-06 DIAGNOSIS — I639 Cerebral infarction, unspecified: Secondary | ICD-10-CM | POA: Diagnosis present

## 2014-08-06 DIAGNOSIS — R509 Fever, unspecified: Secondary | ICD-10-CM | POA: Diagnosis present

## 2014-08-06 DIAGNOSIS — I10 Essential (primary) hypertension: Secondary | ICD-10-CM | POA: Diagnosis present

## 2014-08-06 DIAGNOSIS — E876 Hypokalemia: Secondary | ICD-10-CM | POA: Diagnosis present

## 2014-08-06 DIAGNOSIS — E785 Hyperlipidemia, unspecified: Secondary | ICD-10-CM | POA: Diagnosis present

## 2014-08-06 DIAGNOSIS — R0600 Dyspnea, unspecified: Secondary | ICD-10-CM

## 2014-08-06 DIAGNOSIS — R29810 Facial weakness: Secondary | ICD-10-CM | POA: Diagnosis present

## 2014-08-06 DIAGNOSIS — J9600 Acute respiratory failure, unspecified whether with hypoxia or hypercapnia: Secondary | ICD-10-CM

## 2014-08-06 DIAGNOSIS — E1149 Type 2 diabetes mellitus with other diabetic neurological complication: Secondary | ICD-10-CM

## 2014-08-06 DIAGNOSIS — Z79899 Other long term (current) drug therapy: Secondary | ICD-10-CM

## 2014-08-06 DIAGNOSIS — I6523 Occlusion and stenosis of bilateral carotid arteries: Secondary | ICD-10-CM | POA: Diagnosis present

## 2014-08-06 DIAGNOSIS — R230 Cyanosis: Secondary | ICD-10-CM | POA: Diagnosis present

## 2014-08-06 DIAGNOSIS — H510 Palsy (spasm) of conjugate gaze: Secondary | ICD-10-CM | POA: Diagnosis present

## 2014-08-06 DIAGNOSIS — R233 Spontaneous ecchymoses: Secondary | ICD-10-CM | POA: Diagnosis present

## 2014-08-06 DIAGNOSIS — F0391 Unspecified dementia with behavioral disturbance: Secondary | ICD-10-CM

## 2014-08-06 DIAGNOSIS — R34 Anuria and oliguria: Secondary | ICD-10-CM | POA: Diagnosis present

## 2014-08-06 DIAGNOSIS — G8194 Hemiplegia, unspecified affecting left nondominant side: Secondary | ICD-10-CM | POA: Diagnosis present

## 2014-08-06 DIAGNOSIS — I6309 Cerebral infarction due to thrombosis of other precerebral artery: Secondary | ICD-10-CM

## 2014-08-06 DIAGNOSIS — I63031 Cerebral infarction due to thrombosis of right carotid artery: Secondary | ICD-10-CM

## 2014-08-06 DIAGNOSIS — J9690 Respiratory failure, unspecified, unspecified whether with hypoxia or hypercapnia: Secondary | ICD-10-CM

## 2014-08-06 HISTORY — PX: RADIOLOGY WITH ANESTHESIA: SHX6223

## 2014-08-06 LAB — RAPID URINE DRUG SCREEN, HOSP PERFORMED
AMPHETAMINES: NOT DETECTED
Barbiturates: NOT DETECTED
Benzodiazepines: NOT DETECTED
Cocaine: NOT DETECTED
OPIATES: NOT DETECTED
Tetrahydrocannabinol: NOT DETECTED

## 2014-08-06 LAB — I-STAT CHEM 8, ED
BUN: 16 mg/dL (ref 6–23)
CHLORIDE: 102 meq/L (ref 96–112)
Calcium, Ion: 1.08 mmol/L — ABNORMAL LOW (ref 1.13–1.30)
Creatinine, Ser: 0.9 mg/dL (ref 0.50–1.10)
Glucose, Bld: 124 mg/dL — ABNORMAL HIGH (ref 70–99)
HEMATOCRIT: 46 % (ref 36.0–46.0)
Hemoglobin: 15.6 g/dL — ABNORMAL HIGH (ref 12.0–15.0)
Potassium: 3.7 mEq/L (ref 3.7–5.3)
Sodium: 142 mEq/L (ref 137–147)
TCO2: 27 mmol/L (ref 0–100)

## 2014-08-06 LAB — URINALYSIS, ROUTINE W REFLEX MICROSCOPIC
Bilirubin Urine: NEGATIVE
GLUCOSE, UA: NEGATIVE mg/dL
Ketones, ur: 15 mg/dL — AB
Leukocytes, UA: NEGATIVE
Nitrite: NEGATIVE
PROTEIN: NEGATIVE mg/dL
Specific Gravity, Urine: 1.012 (ref 1.005–1.030)
Urobilinogen, UA: 1 mg/dL (ref 0.0–1.0)
pH: 7.5 (ref 5.0–8.0)

## 2014-08-06 LAB — URINE MICROSCOPIC-ADD ON

## 2014-08-06 LAB — COMPREHENSIVE METABOLIC PANEL
ALT: 16 U/L (ref 0–35)
ANION GAP: 11 (ref 5–15)
AST: 26 U/L (ref 0–37)
Albumin: 3.4 g/dL — ABNORMAL LOW (ref 3.5–5.2)
Alkaline Phosphatase: 70 U/L (ref 39–117)
BUN: 17 mg/dL (ref 6–23)
CALCIUM: 8.9 mg/dL (ref 8.4–10.5)
CO2: 28 mEq/L (ref 19–32)
Chloride: 100 mEq/L (ref 96–112)
Creatinine, Ser: 0.88 mg/dL (ref 0.50–1.10)
GFR calc Af Amer: 69 mL/min — ABNORMAL LOW (ref >90)
GFR calc non Af Amer: 59 mL/min — ABNORMAL LOW (ref >90)
Glucose, Bld: 125 mg/dL — ABNORMAL HIGH (ref 70–99)
Potassium: 3.8 mEq/L (ref 3.7–5.3)
Sodium: 139 mEq/L (ref 137–147)
TOTAL PROTEIN: 7.3 g/dL (ref 6.0–8.3)
Total Bilirubin: 0.5 mg/dL (ref 0.3–1.2)

## 2014-08-06 LAB — APTT: aPTT: 30 seconds (ref 24–37)

## 2014-08-06 LAB — CBC
HEMATOCRIT: 42.7 % (ref 36.0–46.0)
HEMOGLOBIN: 14.6 g/dL (ref 12.0–15.0)
MCH: 30.7 pg (ref 26.0–34.0)
MCHC: 34.2 g/dL (ref 30.0–36.0)
MCV: 89.7 fL (ref 78.0–100.0)
Platelets: 129 10*3/uL — ABNORMAL LOW (ref 150–400)
RBC: 4.76 MIL/uL (ref 3.87–5.11)
RDW: 13.2 % (ref 11.5–15.5)
WBC: 5.5 10*3/uL (ref 4.0–10.5)

## 2014-08-06 LAB — DIFFERENTIAL
Basophils Absolute: 0 10*3/uL (ref 0.0–0.1)
Basophils Relative: 1 % (ref 0–1)
EOS ABS: 0.1 10*3/uL (ref 0.0–0.7)
Eosinophils Relative: 2 % (ref 0–5)
Lymphocytes Relative: 35 % (ref 12–46)
Lymphs Abs: 1.9 10*3/uL (ref 0.7–4.0)
MONOS PCT: 15 % — AB (ref 3–12)
Monocytes Absolute: 0.8 10*3/uL (ref 0.1–1.0)
Neutro Abs: 2.6 10*3/uL (ref 1.7–7.7)
Neutrophils Relative %: 47 % (ref 43–77)

## 2014-08-06 LAB — PROTIME-INR
INR: 1.06 (ref 0.00–1.49)
Prothrombin Time: 13.9 seconds (ref 11.6–15.2)

## 2014-08-06 LAB — I-STAT TROPONIN, ED: TROPONIN I, POC: 0.02 ng/mL (ref 0.00–0.08)

## 2014-08-06 LAB — ETHANOL

## 2014-08-06 LAB — CBG MONITORING, ED: GLUCOSE-CAPILLARY: 114 mg/dL — AB (ref 70–99)

## 2014-08-06 SURGERY — RADIOLOGY WITH ANESTHESIA
Anesthesia: General

## 2014-08-06 MED ORDER — LIDOCAINE HCL 1 % IJ SOLN
INTRAMUSCULAR | Status: AC
  Filled 2014-08-06: qty 20

## 2014-08-06 MED ORDER — LABETALOL HCL 5 MG/ML IV SOLN
10.0000 mg | Freq: Once | INTRAVENOUS | Status: AC
  Administered 2014-08-06: 10 mg via INTRAVENOUS
  Filled 2014-08-06: qty 4

## 2014-08-06 MED ORDER — ONDANSETRON HCL 4 MG/2ML IJ SOLN
INTRAMUSCULAR | Status: DC | PRN
  Administered 2014-08-06: 4 mg via INTRAVENOUS

## 2014-08-06 MED ORDER — GLYCOPYRROLATE 0.2 MG/ML IJ SOLN
INTRAMUSCULAR | Status: DC | PRN
  Administered 2014-08-06 (×2): 0.4 mg via INTRAVENOUS

## 2014-08-06 MED ORDER — NITROGLYCERIN 1 MG/10 ML FOR IR/CATH LAB
INTRA_ARTERIAL | Status: AC
  Filled 2014-08-06: qty 10

## 2014-08-06 MED ORDER — LIDOCAINE HCL (CARDIAC) 20 MG/ML IV SOLN
INTRAVENOUS | Status: AC
  Filled 2014-08-06: qty 5

## 2014-08-06 MED ORDER — ROCURONIUM BROMIDE 50 MG/5ML IV SOLN
INTRAVENOUS | Status: AC
  Filled 2014-08-06: qty 2

## 2014-08-06 MED ORDER — ALTEPLASE 30 MG/30 ML FOR INTERV. RAD
1.0000 mg | INTRA_ARTERIAL | Status: AC | PRN
  Administered 2014-08-07: 10 mg via INTRA_ARTERIAL
  Filled 2014-08-06: qty 30

## 2014-08-06 MED ORDER — ALTEPLASE (STROKE) FULL DOSE INFUSION
0.9000 mg/kg | Freq: Once | INTRAVENOUS | Status: AC
  Administered 2014-08-06: 69 mg via INTRAVENOUS
  Filled 2014-08-06: qty 69

## 2014-08-06 MED ORDER — ALTEPLASE (STROKE) FULL DOSE INFUSION: 0.9000 mg/kg | Freq: Once | INTRAVENOUS | Status: DC

## 2014-08-06 MED ORDER — FENTANYL CITRATE 0.05 MG/ML IJ SOLN
INTRAMUSCULAR | Status: DC | PRN
  Administered 2014-08-06: 100 ug via INTRAVENOUS
  Administered 2014-08-06 (×3): 50 ug via INTRAVENOUS

## 2014-08-06 MED ORDER — LABETALOL HCL 5 MG/ML IV SOLN: 10.0000 mg | INTRAVENOUS | Status: DC | PRN

## 2014-08-06 MED ORDER — SODIUM CHLORIDE 0.9 % IV SOLN
10.0000 mg | INTRAVENOUS | Status: DC | PRN
  Administered 2014-08-06: 15 ug/min via INTRAVENOUS

## 2014-08-06 MED ORDER — SODIUM CHLORIDE 0.9 % IV SOLN
INTRAVENOUS | Status: DC | PRN
  Administered 2014-08-06 (×2): via INTRAVENOUS

## 2014-08-06 MED ORDER — PROPOFOL 10 MG/ML IV BOLUS
INTRAVENOUS | Status: DC | PRN
  Administered 2014-08-06 – 2014-08-07 (×4): 50 mg via INTRAVENOUS

## 2014-08-06 MED ORDER — IOHEXOL 300 MG/ML  SOLN
125.0000 mL | Freq: Once | INTRAMUSCULAR | Status: AC | PRN
  Administered 2014-08-06: 80 mL via INTRAVENOUS

## 2014-08-06 MED ORDER — PROPOFOL 10 MG/ML IV EMUL
INTRAVENOUS | Status: AC
  Administered 2014-08-06: 20 mg
  Filled 2014-08-06: qty 100

## 2014-08-06 MED ORDER — SUCCINYLCHOLINE CHLORIDE 20 MG/ML IJ SOLN
INTRAMUSCULAR | Status: AC
  Administered 2014-08-06: 80 mg
  Filled 2014-08-06: qty 1

## 2014-08-06 MED ORDER — SODIUM CHLORIDE 0.9 % IV SOLN
250.0000 mL | Freq: Once | INTRAVENOUS | Status: AC
  Administered 2014-08-06: 250 mL via INTRAVENOUS

## 2014-08-06 MED ORDER — ETOMIDATE 2 MG/ML IV SOLN
INTRAVENOUS | Status: AC
  Administered 2014-08-06: 15 mg
  Filled 2014-08-06: qty 20

## 2014-08-06 MED ORDER — VECURONIUM BROMIDE 10 MG IV SOLR
INTRAVENOUS | Status: DC | PRN
  Administered 2014-08-06: 4 mg via INTRAVENOUS
  Administered 2014-08-06: 6 mg via INTRAVENOUS
  Administered 2014-08-07: 3 mg via INTRAVENOUS

## 2014-08-06 MED ORDER — ARTIFICIAL TEARS OP OINT
TOPICAL_OINTMENT | OPHTHALMIC | Status: DC | PRN
  Administered 2014-08-06: 1 via OPHTHALMIC

## 2014-08-06 NOTE — Anesthesia Procedure Notes (Signed)
Date/Time: 28-Jan-2014 9:50 PM Performed by: Wray KearnsFOLEY, Marge Vandermeulen A Pre-anesthesia Checklist: Patient identified, Emergency Drugs available, Suction available, Patient being monitored and Timeout performed Patient Re-evaluated:Patient Re-evaluated prior to inductionOxygen Delivery Method: Circle system utilized Preoxygenation: Pre-oxygenation with 100% oxygen Intubation Type: Combination inhalational/ intravenous induction Tube type: Subglottic suction tube Placement Confirmation: positive ETCO2 and breath sounds checked- equal and bilateral Secured at: 22 cm Tube secured with: Tape Dental Injury: Teeth and Oropharynx as per pre-operative assessment  Comments: OETT pre-existing pulled back to 22cm at lip . Now able to auscultate LUL . To have CXR when procedure finished .

## 2014-08-06 NOTE — Sedation Documentation (Signed)
Per ER, onset was 08/15/2014 1940  Recanulation at 08/13/2014  2305

## 2014-08-06 NOTE — ED Notes (Signed)
MD Hosie PoissonSumner with Neurology at bedside.

## 2014-08-06 NOTE — ED Notes (Signed)
[facility] for code stroke, pt were on the shower with staff when she got very weak on her left side, stopped getting involved on conversation with the staff, pt usually able to ambulate well.

## 2014-08-06 NOTE — ED Notes (Addendum)
Pt being prepared for intubation. Verbal order for Etomidate 15 mg IVP and Succinylcholine 80 mg IVP by EDP Resident Marcha SoldersBrtalik. EDP Chrys Racerzavitz, Kelly D. RN, Darryl EMT, Cletis AthensKen EMT, Arther DamesLeslie RN, Brooke RR RN, Kasey RT at bedside for intubation. Dr. Hosie PoissonSumner at bedside with neurology

## 2014-08-06 NOTE — ED Notes (Signed)
Pt transferred to IR via stretcher by Nehemiah SettleBrooke RR RN and Gap Incandy RN

## 2014-08-06 NOTE — Anesthesia Preprocedure Evaluation (Signed)
Anesthesia Evaluation  Patient identified by MRN, date of birth, ID band Patient unresponsive    Reviewed: Unable to perform ROS - Chart review only  Airway       Dental   Pulmonary          Cardiovascular hypertension,     Neuro/Psych CVA    GI/Hepatic   Endo/Other  diabetes  Renal/GU      Musculoskeletal   Abdominal   Peds  Hematology   Anesthesia Other Findings Dementia  Reproductive/Obstetrics                           Anesthesia Physical Anesthesia Plan  ASA: IV  Anesthesia Plan: General   Post-op Pain Management:    Induction: Intravenous  Airway Management Planned:   Additional Equipment: Arterial line  Intra-op Plan:   Post-operative Plan: Post-operative intubation/ventilation  Informed Consent:   Only emergency history available  Plan Discussed with: CRNA, Anesthesiologist and Surgeon  Anesthesia Plan Comments:         Anesthesia Quick Evaluation

## 2014-08-06 NOTE — Progress Notes (Signed)
80 y.o  brought in from SNF for stroke symptoms, CODE STROKE called per EMS. Per EMS nursing staff at SNF observed pt slump over in shower and became non responsive at 1940. Left side arm flaccid, left facial droop, and minimal movement in left leg assessed in field. Pt history includes dementia but her baseline is conversant. Other history includes DM and HTN. NO blood thinners listed on pt MAR from SNF. Pt emergently taken to CT scan after being cleared per EDP. NIHSS completed upon return to ED room, yielding 25. CBG 114. Pt unable to maintain airway, intubated at 2045. Pt hypertensive prior to starting tpa, labetalol given IVP, sedation meds also started for agitation on vent. Dr. Wyatt PortelaSomner spoke with pt husband via phone concerning tpa and possible IR treatment. En route to hospital.TPA started at 2107. Pt taken to IR at 2135, tpa infusing.

## 2014-08-06 NOTE — Consult Note (Signed)
Stroke Consult    Chief Complaint: AMS and left sided weakness HPI: Monica [name] is an 80 y.o. female with history of dementia presenting as code stroke for AMS and left sided symptoms concerning for a R MCA infarct. Per EMS patient was in normal state of health, at 1940 was in the shower and suddenly slumped over and became unresponsive. EMS noted right gaze preference and flaccid LUE. BS normal with BP 140/98 upon arrival. Upon arrival patient moans to noxious stimuli but otherwise non verbal. Per family and EMS notes patient has baseline dementia but can hold a conversation and is appropriately interactive. Called her husband to discuss findings and my concern for a right MCA infarct. Offered him option of IV tPA and possible IR treatment. Counseled on benefits and risks of the procedures. He expressed understanding and stated, "do what needs to be done". Discussed CT head results with radiology, Dr Constance Goltzlson, shows no ICH, no evidence of large acute infarct. Case discussed with IR Dr Corliss Skainseveshwar, will proceed to IR after tPA. Patient intubated for airway protection. Received 10mg  labetalol prior to tPA with BP down to 180/84 prior to tPA admission.   Date last known well: 07/22/2014 Time last known well: 1940 tPA Given: yes, started 2107 for suspected right MCA infarct NIHSS: 25 in ED  Past Medical History  Diagnosis Date  . Dementia with Lewy bodies   . Dementia, unspecified, with behavioral disturbance   . Diabetes   . HTN (hypertension)   . Anxiety   . Dementia with behavioral disturbance 04/15/2013    Past Surgical History  Procedure Laterality Date  . Back surgery      History reviewed. No pertinent family history. Social History:  reports that she has never smoked. She has never used smokeless tobacco. She reports that she does not drink alcohol or use illicit drugs.  Allergies:  Allergies  Allergen Reactions  . Asa [Aspirin]      (Not in a hospital admission)  ROS: Out of  a complete 14 system review, the patient complains of only the following symptoms, and all other reviewed systems are negative. -unable to respond due to mental status  Physical Examination: SpO2 94.00%.  General Examination: HEENT-  Normocephalic, no lesions, without obvious abnormality.  Normal external eye and conjunctiva.  Normal auditory canals and external ears. Normal external nose, mucus membranes and septum.  Normal pharynx. Neck supple with no masses, nodes, nodules or enlargement. Cardiovascular -RRR, no M/R/G Lungs -CTA bilat Abdomen - soft, NT, ND Extremities - mild LE edema bilaterall Neurologic Examination: Mental Status: Eyes open, obtunded, not following commands, moans intermittently, otherwise non-verbal.  Cranial Nerves: II: unable to visualize fundi secondary to pupil size, right gaze preference, unable to get eyes past midline, pupils equal, round, reactive to light . Appears to neglect the left side III,IV, VI: ptosis right eye, right gaze preference V,VII: left facial droop IX,X: gag reflex present XII: tongue strength normal  Motor: Moves right side spontaneously and against light resistance. Flaccid LUE, no withdrawal with noxious stimuli. No spontaneous movement LLE, withdrawals to noxious stimuli. Sensory: withdrawals briskly to noxious stimuli on right side. No withdrawal or localization with noxious stimuli to LUE. Withdrawals LLE Deep Tendon Reflexes: 2+ and symmetric throughout Plantars: Right: downgoing   Left: downgoing Cerebellar: Unable to test due to mental status Gait: unable to test due to mental status  Laboratory Studies:   Basic Metabolic Panel:  Recent Labs Lab 08/10/2014 2018  NA 142  K 3.7  CL 102  GLUCOSE 124*  BUN 16  CREATININE 0.90    Liver Function Tests: No results found for this basename: AST, ALT, ALKPHOS, BILITOT, PROT, ALBUMIN,  in the last 168 hours No results found for this basename: LIPASE, AMYLASE,  in the last  168 hours No results found for this basename: AMMONIA,  in the last 168 hours  CBC:  Recent Labs Lab 08/05/2014 2013 07/23/2014 2018  WBC 5.5  --   NEUTROABS 2.6  --   HGB 14.6 15.6*  HCT 42.7 46.0  MCV 89.7  --   PLT 129*  --     Cardiac Enzymes: No results found for this basename: CKTOTAL, CKMB, CKMBINDEX, TROPONINI,  in the last 168 hours  BNP: No components found with this basename: POCBNP,   CBG:  Recent Labs Lab 08/02/2014 2040  GLUCAP 114*    Microbiology: Results for orders placed during the hospital encounter of 03/13/14  URINE CULTURE     Status: None   Collection Time    03/13/14  6:28 PM      Result Value Ref Range Status   Specimen Description URINE, CATHETERIZED   Final   Special Requests NONE   Final   Culture  Setup Time     Final   Value: 03/14/2014 03:34     Performed at [facility] Count     Final   Value: NO GROWTH     Performed at [facility]   Culture     Final   Value: NO GROWTH     Performed at [facility]   Report Status 03/14/2014 FINAL   Final    Coagulation Studies:  Recent Labs  08/13/2014 2013  LABPROT 13.9  INR 1.06    Urinalysis: No results found for this basename: COLORURINE, APPERANCEUR, LABSPEC, PHURINE, GLUCOSEU, HGBUR, BILIRUBINUR, KETONESUR, PROTEINUR, UROBILINOGEN, NITRITE, LEUKOCYTESUR,  in the last 168 hours  Lipid Panel:  No results found for this basename: chol, trig, hdl, cholhdl, vldl, ldlcalc    HgbA1C:  No results found for this basename: HGBA1C    Urine Drug Screen:   No results found for this basename: labopia, cocainscrnur, labbenz, amphetmu, thcu, labbarb    Alcohol Level: No results found for this basename: ETH,  in the last 168 hours  Other results: EKG: normal EKG, normal sinus rhythm  Imaging: No results found.  Assessment: 80 y.o. female presenting as code stroke for acute onset AMS and left sided weakness. Suspect R MCA infarct, likely embolic etiology.  Discussed with patients spouse and decision made to proceed with IV tPA and IR for possible intervention. Family counseled on risks/benefits of IV tPA. Diagnostic angio showed a distal R ICA occlusion. Patient received IA tPA and 4 passes with stent retrieval device. Noted complete revascularization of occluded RT ICA, RT MCA and RT ACA A1 with partial revascularization of A2.   Stroke Risk Factors - DM, HTN  Plan:  1. Per Dr Sharla Kidney, goal SBP 120-140. Labetalol and cardene gtt as needed 2. Admit to Neuro-ICU 3. Repeat head imaging at 24hrs, start antiplatelet therapy at that time (Has ASA allergy). 4. HgbA1c, fasting lipid panel 5. MRI, MRA  of the brain without contrast 6. Frequent neuro checks 7. Echocardiogram 8. Carotid dopplers 9. Prophylactic therapy-held post tPA 10. Risk factor modification 11.Telemetry monitoring 12. PT consult, OT consult, Speech consult   This patient is critically ill and at significant risk of neurological worsening, death and care requires  constant monitoring of vital signs, hemodynamics,respiratory and cardiac monitoring,review of multiple databases, neurological assessment, discussion with family, other specialists and medical decision making of high complexity. I spent 90 minutes of neurocritical care time in the care of this patient.   Elspeth Choeter Vinnie Bobst, DO [facility] [phone]  If 7pm- 7am, please page neurology on call as listed in [facility]. 08/10/2014, 8:49 PM

## 2014-08-06 NOTE — ED Provider Notes (Signed)
CSN: [id number]     Arrival date & time 11/28/2013  2010 History   First MD Initiated Contact with Patient 11/28/2013 2016     Chief Complaint  Patient presents with  . Code Stroke     (Consider location/radiation/quality/duration/timing/severity/associated sxs/prior Treatment) HPI Comments: 80 year old female with past medical history of blue body dementia, diabetes, hypertension comes in with chief complaint of stroke. Patient was being bathed at nursing home when she suddenly dropped to her knees. She is now minimally responsive. In route had normal blood sugar noted left upper arm flacidity. Left facial droop. Left-sided neglect. Patient cannot communicate.  Level V caveat as patient altered  Patient is a 80 y.o. female presenting with Acute Neurological Problem.  Cerebrovascular Accident This is a new problem. The current episode started today (30 minutes ago). The problem occurs constantly. The problem has been unchanged. Associated symptoms comments: Patient is minimally responsive, cannot assess other symptoms . Nothing aggravates the symptoms. She has tried nothing for the symptoms. The treatment provided no relief.    Past Medical History  Diagnosis Date  . Dementia with Lewy bodies   . Dementia, unspecified, with behavioral disturbance   . Diabetes   . HTN (hypertension)   . Anxiety   . Dementia with behavioral disturbance 04/15/2013   Past Surgical History  Procedure Laterality Date  . Back surgery     History reviewed. No pertinent family history. History  Substance Use Topics  . Smoking status: Never Smoker   . Smokeless tobacco: Never Used  . Alcohol Use: No   OB History   Grav Para Term Preterm Abortions TAB SAB Ect Mult Living                 Review of Systems  Unable to perform ROS: Mental status change      Allergies  Asa; Depakote; Lorazepam; and Risperidone and related  Home Medications   Prior to Admission medications   Medication Sig Start  Date End Date Taking? Authorizing Provider  ALPRAZolam Prudy Feeler(XANAX) 0.5 MG tablet Take 0.5 mg by mouth 2 (two) times daily. SCHEDULED 0800 and 1600   Yes Historical Provider, MD  ALPRAZolam (XANAX) 0.5 MG tablet Take 0.5 mg by mouth 2 (two) times daily as needed for anxiety. May give within 1 hour of scheduled dose for increased anxiety or agitation, if needed   Yes Historical Provider, MD  cholecalciferol (VITAMIN D) 1000 UNITS tablet Take 1,000 Units by mouth daily.   Yes Historical Provider, MD  donepezil (ARICEPT) 10 MG tablet Take 10 mg by mouth at bedtime.   Yes Historical Provider, MD  Melatonin 5 MG TABS Take 5 mg by mouth at bedtime.   Yes Historical Provider, MD  Multiple Vitamins-Minerals (MULTIVITAMIN PO) Take 1 tablet by mouth daily.   Yes Historical Provider, MD  naproxen sodium (ANAPROX) 220 MG tablet Take 220 mg by mouth 2 (two) times daily as needed (for pain).   Yes Historical Provider, MD  polyethylene glycol (MIRALAX / GLYCOLAX) packet Take 17 g by mouth daily as needed for mild constipation.   Yes Historical Provider, MD  sertraline (ZOLOFT) 25 MG tablet Take 25 mg by mouth at bedtime.   Yes Historical Provider, MD   BP 151/64  Pulse 65  Temp(Src) 97.3 F (36.3 C)  Resp 19  Ht 5\' 7"  (1.702 m)  Wt 167 lb 8.8 oz (76 kg)  BMI 26.24 kg/m2  SpO2 98% Physical Exam  Constitutional: She appears well-developed and well-nourished. She appears  distressed.  HENT:  Head: Normocephalic and atraumatic.  Mouth/Throat: Oropharynx is clear and moist. No oropharyngeal exudate.  No signs of head trauma  Eyes: Conjunctivae and EOM are normal. Pupils are equal, round, and reactive to light. Right eye exhibits no discharge. Left eye exhibits no discharge. No scleral icterus.  Left side   neglect   Neck: Normal range of motion. Neck supple.  Cardiovascular: Normal rate, regular rhythm and normal heart sounds.   No murmur heard. Pulmonary/Chest: Effort normal and breath sounds normal. No  respiratory distress. She has no wheezes. She has no rales.  Not protecting airway  Abdominal: Soft. She exhibits no distension and no mass. There is no tenderness.  Neurological:  Patient moaning, not following commands 0/5 left upper ext 2/5 LLextremity 5/5 right-sided extremities Withdraws to pain in all extremities except left upper Left facial droop  Skin: Skin is warm. No rash noted. She is not diaphoretic.    ED Course  INTUBATION Date/Time: 08/07/2014 12:23 AM Performed by: Pilar Jarvis Authorized by: Pilar Jarvis Consent: The procedure was performed in an emergent situation. Patient identity confirmed: anonymous protocol, patient vented/unresponsive Indications: airway protection Intubation method: direct Patient status: paralyzed (RSI) Preoxygenation: nonrebreather mask Sedatives: etomidate Paralytic: succinylcholine Laryngoscope size: Mac 3 Tube size: 7.5 mm Tube type: cuffed Number of attempts: 1 Cricoid pressure: yes Cords visualized: yes Post-procedure assessment: chest rise Breath sounds: equal Cuff inflated: yes ETT to lip: 23 cm Tube secured with: ETT holder Chest x-ray interpreted by me. Chest x-ray findings: endotracheal tube too low Tube repositioned: tube repositioned successfully Patient tolerance: Patient tolerated the procedure well with no immediate complications.   (including critical care time) Labs Review Labs Reviewed  CBC - Abnormal; Notable for the following:    Platelets 129 (*)    All other components within normal limits  DIFFERENTIAL - Abnormal; Notable for the following:    Monocytes Relative 15 (*)    All other components within normal limits  COMPREHENSIVE METABOLIC PANEL - Abnormal; Notable for the following:    Glucose, Bld 125 (*)    Albumin 3.4 (*)    GFR calc non Af Amer 59 (*)    GFR calc Af Amer 69 (*)    All other components within normal limits  URINALYSIS, ROUTINE W REFLEX MICROSCOPIC - Abnormal; Notable for  the following:    Hgb urine dipstick TRACE (*)    Ketones, ur 15 (*)    All other components within normal limits  URINE MICROSCOPIC-ADD ON - Abnormal; Notable for the following:    Squamous Epithelial / LPF FEW (*)    Bacteria, UA FEW (*)    All other components within normal limits  I-STAT CHEM 8, ED - Abnormal; Notable for the following:    Glucose, Bld 124 (*)    Calcium, Ion 1.08 (*)    Hemoglobin 15.6 (*)    All other components within normal limits  CBG MONITORING, ED - Abnormal; Notable for the following:    Glucose-Capillary 114 (*)    All other components within normal limits  ETHANOL  PROTIME-INR  APTT  URINE RAPID DRUG SCREEN (HOSP PERFORMED)  BLOOD GAS, ARTERIAL  I-STAT TROPOININ, ED  I-STAT TROPOININ, ED    Imaging Review Ct Head Wo Contrast  08/14/2014   CLINICAL DATA:  80 year old female with left-sided weakness in arms and legs. Severe left sided facial droop with gaze to the right. Diabetes, high blood pressure and dementia. Code stroke. Initial encounter.  EXAM: CT HEAD  WITHOUT CONTRAST  TECHNIQUE: Contiguous axial images were obtained from the base of the skull through the vertex without intravenous contrast.  COMPARISON:  03/13/2014 CT.  FINDINGS: Head tilt and mild motion degradation slight limit evaluation.  No intracranial hemorrhage.  Small vessel disease type changes without CT evidence of large acute infarct. The slightly dense appearance of the M1 segment of the middle cerebral arteries appears symmetric and therefore not able to conclude that there is a thrombus in the M1 segment of the right middle cerebral artery.  Atrophy without hydrocephalus.  No intracranial mass lesion noted on this unenhanced exam.  Moderate mucosal thickening left maxillary sinus.  IMPRESSION: No intracranial hemorrhage.  Small vessel disease type changes without CT evidence of large acute infarct. The slightly dense appearance of the M1 segment of the middle cerebral arteries appears  symmetric and therefore not able to conclude that there is a thrombus in the M1 segment of the right middle cerebral artery.  These results were called by telephone at the time of interpretation on 07/26/2014 at 8:37 pm to Dr. Blane OharaJOSHUA ZAVITZ , who verbally acknowledged these results.   Electronically Signed   By: Bridgett LarssonSteve  Olson M.D.   On: 08/13/2014 20:47   Dg Chest Portable 1 View  08/07/2014   CLINICAL DATA:  Code stroke.  Intubated and orogastric tube placed.  EXAM: PORTABLE CHEST - 1 VIEW  COMPARISON:  03/13/2014.  FINDINGS: The endotracheal tube tip is in the right mainstem bronchus. Grossly stable enlarged cardiac silhouette and prominent interstitial markings. Diffuse osteopenia. Nasogastric tube coiled in the proximal stomach with its tip most likely in the distal stomach, not included. Possible femoral vein catheter extending from the left lower lumbar spine region to the midline with its tip overlying the upper lumbar spine. Upper abdominal surgical clips. Bilateral shoulder degenerative changes, greater on the right. Diffuse osteopenia.  IMPRESSION: 1. Endotracheal tube tip in the right mainstem bronchus. It is recommended that this be retracted 4.5 cm. 2. Stable cardiomegaly and chronic interstitial lung disease. 3. Abnormally positioned possible left femoral vein catheter. This may be within a duplicated inferior vena cava and extending into the more right-sided inferior vena cava or not within a central vein.   Electronically Signed   By: Gordan PaymentSteve  Reid M.D.   On: 08/08/2014 21:40   Dg Abd Portable 1v  08/10/2014   CLINICAL DATA:  Code stroke.  Orogastric tube placement  EXAM: PORTABLE ABDOMEN - 1 VIEW  COMPARISON:  None.  FINDINGS: An orogastric tube is coiled in the stomach, with tip terminating in the region of the body or antrum. Multiple wires and leads overlap the abdomen. Pelvic thermistor present. No evidence for bowel obstruction. Cholecystectomy changes.  IMPRESSION: Orogastric tube is  coiled in the stomach, with tip in the body/antrum.   Electronically Signed   By: Tiburcio PeaJonathan  Watts M.D.   On: 08/16/2014 21:37     EKG Interpretation None      MDM   MDM: 80 year old female comes in with stroke. Less than one hour ago symptoms started. On arrival, normal blood sugar. Not protecting airway. Head CT shows no bleed. Patient intubated as above. Neurology bedside for remainder of ED stay.. Given TPA and admitted to neurology service.  Final diagnoses:  CVA (cerebral infarction)  Stroke    Admit to Neurology  Pilar Jarvisoug Sidhant Helderman, MD 08/07/14 [phone]

## 2014-08-06 NOTE — Progress Notes (Signed)
Pt bagged to IR

## 2014-08-07 ENCOUNTER — Inpatient Hospital Stay (HOSPITAL_COMMUNITY): Payer: Medicare Other

## 2014-08-07 DIAGNOSIS — I639 Cerebral infarction, unspecified: Secondary | ICD-10-CM

## 2014-08-07 DIAGNOSIS — J9600 Acute respiratory failure, unspecified whether with hypoxia or hypercapnia: Secondary | ICD-10-CM

## 2014-08-07 DIAGNOSIS — E1149 Type 2 diabetes mellitus with other diabetic neurological complication: Secondary | ICD-10-CM

## 2014-08-07 LAB — CBC WITH DIFFERENTIAL/PLATELET
BASOS PCT: 0 % (ref 0–1)
Basophils Absolute: 0 10*3/uL (ref 0.0–0.1)
Eosinophils Absolute: 0.1 10*3/uL (ref 0.0–0.7)
Eosinophils Relative: 1 % (ref 0–5)
HCT: 36 % (ref 36.0–46.0)
Hemoglobin: 12.3 g/dL (ref 12.0–15.0)
LYMPHS PCT: 16 % (ref 12–46)
Lymphs Abs: 1 10*3/uL (ref 0.7–4.0)
MCH: 30.3 pg (ref 26.0–34.0)
MCHC: 34.2 g/dL (ref 30.0–36.0)
MCV: 88.7 fL (ref 78.0–100.0)
Monocytes Absolute: 0.9 10*3/uL (ref 0.1–1.0)
Monocytes Relative: 14 % — ABNORMAL HIGH (ref 3–12)
NEUTROS ABS: 4.3 10*3/uL (ref 1.7–7.7)
Neutrophils Relative %: 69 % (ref 43–77)
Platelets: 126 10*3/uL — ABNORMAL LOW (ref 150–400)
RBC: 4.06 MIL/uL (ref 3.87–5.11)
RDW: 13.2 % (ref 11.5–15.5)
WBC: 6.2 10*3/uL (ref 4.0–10.5)

## 2014-08-07 LAB — BLOOD GAS, ARTERIAL
Acid-base deficit: 2.5 mmol/L — ABNORMAL HIGH (ref 0.0–2.0)
Bicarbonate: 21.7 mEq/L (ref 20.0–24.0)
DRAWN BY: 252031
FIO2: 1 %
LHR: 10 {breaths}/min
MECHVT: 500 mL
O2 Saturation: 100 %
PEEP/CPAP: 5 cmH2O
PO2 ART: 467 mmHg — AB (ref 80.0–100.0)
Patient temperature: 98.6
TCO2: 22.8 mmol/L (ref 0–100)
pCO2 arterial: 36.6 mmHg (ref 35.0–45.0)
pH, Arterial: 7.39 (ref 7.350–7.450)

## 2014-08-07 LAB — GLUCOSE, CAPILLARY
GLUCOSE-CAPILLARY: 95 mg/dL (ref 70–99)
GLUCOSE-CAPILLARY: 96 mg/dL (ref 70–99)
GLUCOSE-CAPILLARY: 112 mg/dL — AB (ref 70–99)
Glucose-Capillary: 123 mg/dL — ABNORMAL HIGH (ref 70–99)
Glucose-Capillary: 98 mg/dL (ref 70–99)

## 2014-08-07 LAB — BASIC METABOLIC PANEL
ANION GAP: 12 (ref 5–15)
BUN: 14 mg/dL (ref 6–23)
CALCIUM: 7.5 mg/dL — AB (ref 8.4–10.5)
CO2: 22 mEq/L (ref 19–32)
Chloride: 107 mEq/L (ref 96–112)
Creatinine, Ser: 0.67 mg/dL (ref 0.50–1.10)
GFR calc Af Amer: >90 mL/min (ref >90)
GFR, EST NON AFRICAN AMERICAN: 79 mL/min — AB (ref >90)
Glucose, Bld: 167 mg/dL — ABNORMAL HIGH (ref 70–99)
Potassium: 3.4 mEq/L — ABNORMAL LOW (ref 3.7–5.3)
SODIUM: 141 meq/L (ref 137–147)

## 2014-08-07 LAB — LIPID PANEL
CHOLESTEROL: 117 mg/dL (ref 0–200)
HDL: 31 mg/dL — ABNORMAL LOW (ref >39)
LDL Cholesterol: 67 mg/dL (ref 0–99)
TRIGLYCERIDES: 96 mg/dL (ref <150)
Total CHOL/HDL Ratio: 3.8 RATIO
VLDL: 19 mg/dL (ref 0–40)

## 2014-08-07 LAB — MRSA PCR SCREENING: MRSA BY PCR: NEGATIVE

## 2014-08-07 LAB — HEMOGLOBIN A1C
HEMOGLOBIN A1C: 5.1 % (ref <5.7)
MEAN PLASMA GLUCOSE: 100 mg/dL (ref <117)

## 2014-08-07 LAB — TROPONIN I: Troponin I: <0.3 ng/mL (ref <0.30)

## 2014-08-07 LAB — TRIGLYCERIDES: TRIGLYCERIDES: 95 mg/dL (ref <150)

## 2014-08-07 MED ORDER — PROPOFOL 10 MG/ML IV EMUL
0.0000 ug/kg/min | INTRAVENOUS | Status: DC
  Administered 2014-08-07: 25 ug/kg/min via INTRAVENOUS
  Administered 2014-08-07: 20 ug/kg/min via INTRAVENOUS
  Filled 2014-08-07 (×3): qty 100

## 2014-08-07 MED ORDER — NICARDIPINE HCL IN NACL 20-0.86 MG/200ML-% IV SOLN
5.0000 mg/h | INTRAVENOUS | Status: DC
Start: 2014-08-07 — End: 2014-08-07
  Administered 2014-08-07: 5 mg/h via INTRAVENOUS

## 2014-08-07 MED ORDER — VITAL HIGH PROTEIN PO LIQD
1000.0000 mL | ORAL | Status: DC
  Administered 2014-08-07: 1000 mL
  Administered 2014-08-08 (×5)
  Filled 2014-08-07 (×3): qty 1000

## 2014-08-07 MED ORDER — ACETAMINOPHEN 325 MG PO TABS: 650.0000 mg | ORAL_TABLET | ORAL | Status: DC | PRN

## 2014-08-07 MED ORDER — HYDRALAZINE HCL 20 MG/ML IJ SOLN: 10.0000 mg | INTRAMUSCULAR | Status: DC | PRN

## 2014-08-07 MED ORDER — PANTOPRAZOLE SODIUM 40 MG IV SOLR
40.0000 mg | Freq: Every day | INTRAVENOUS | Status: DC
  Administered 2014-08-07: 40 mg via INTRAVENOUS

## 2014-08-07 MED ORDER — ACETAMINOPHEN 650 MG RE SUPP: 650.0000 mg | Freq: Four times a day (QID) | RECTAL | Status: DC | PRN

## 2014-08-07 MED ORDER — NICARDIPINE HCL IN NACL 20-0.86 MG/200ML-% IV SOLN
INTRAVENOUS | Status: AC
  Filled 2014-08-07: qty 200

## 2014-08-07 MED ORDER — ACETAMINOPHEN 500 MG PO TABS
1000.0000 mg | ORAL_TABLET | Freq: Four times a day (QID) | ORAL | Status: DC | PRN
  Administered 2014-08-07 – 2014-08-09 (×6): 1000 mg
  Filled 2014-08-07 (×6): qty 2

## 2014-08-07 MED ORDER — SODIUM CHLORIDE 0.9 % IV SOLN
INTRAVENOUS | Status: DC
  Administered 2014-08-07 – 2014-08-08 (×2): via INTRAVENOUS

## 2014-08-07 MED ORDER — ACETAMINOPHEN 500 MG PO TABS: 1000.0000 mg | ORAL_TABLET | Freq: Four times a day (QID) | ORAL | Status: DC | PRN

## 2014-08-07 MED ORDER — ONDANSETRON HCL 4 MG/2ML IJ SOLN: 4.0000 mg | Freq: Four times a day (QID) | INTRAMUSCULAR | Status: DC | PRN

## 2014-08-07 MED ORDER — STROKE: EARLY STAGES OF RECOVERY BOOK
Freq: Once | Status: DC
  Filled 2014-08-07: qty 1

## 2014-08-07 MED ORDER — PROPOFOL INFUSION 10 MG/ML OPTIME
INTRAVENOUS | Status: DC | PRN
  Administered 2014-08-06: 50 ug/kg/min via INTRAVENOUS

## 2014-08-07 MED ORDER — POTASSIUM CHLORIDE 20 MEQ/15ML (10%) PO LIQD
40.0000 meq | Freq: Once | ORAL | Status: AC
  Administered 2014-08-07: 40 meq
  Filled 2014-08-07: qty 30

## 2014-08-07 MED ORDER — CETYLPYRIDINIUM CHLORIDE 0.05 % MT LIQD
7.0000 mL | Freq: Four times a day (QID) | OROMUCOSAL | Status: DC
  Administered 2014-08-07 – 2014-08-10 (×14): 7 mL via OROMUCOSAL

## 2014-08-07 MED ORDER — CLOPIDOGREL BISULFATE 75 MG PO TABS
75.0000 mg | ORAL_TABLET | Freq: Every day | ORAL | Status: DC
  Administered 2014-08-07 – 2014-08-10 (×4): 75 mg via ORAL
  Filled 2014-08-07 (×4): qty 1

## 2014-08-07 MED ORDER — FENTANYL CITRATE 0.05 MG/ML IJ SOLN: 50.0000 ug | INTRAMUSCULAR | Status: DC | PRN

## 2014-08-07 MED ORDER — CHLORHEXIDINE GLUCONATE 0.12 % MT SOLN
15.0000 mL | Freq: Two times a day (BID) | OROMUCOSAL | Status: DC
  Administered 2014-08-07 – 2014-08-10 (×7): 15 mL via OROMUCOSAL
  Filled 2014-08-07 (×8): qty 15

## 2014-08-07 MED ORDER — SENNOSIDES-DOCUSATE SODIUM 8.6-50 MG PO TABS
1.0000 | ORAL_TABLET | Freq: Every evening | ORAL | Status: DC | PRN
Start: 2014-08-07 — End: 2014-08-10
  Filled 2014-08-07: qty 1

## 2014-08-07 MED ORDER — ACETAMINOPHEN 650 MG RE SUPP: 650.0000 mg | RECTAL | Status: DC | PRN

## 2014-08-07 MED ORDER — METOPROLOL TARTRATE 1 MG/ML IV SOLN: 2.5000 mg | INTRAVENOUS | Status: DC | PRN

## 2014-08-07 MED ORDER — INSULIN ASPART 100 UNIT/ML ~~LOC~~ SOLN
0.0000 [IU] | SUBCUTANEOUS | Status: DC
  Administered 2014-08-07: 2 [IU] via SUBCUTANEOUS
  Administered 2014-08-07: 3 [IU] via SUBCUTANEOUS
  Administered 2014-08-08 – 2014-08-09 (×8): 2 [IU] via SUBCUTANEOUS
  Administered 2014-08-10 (×3): 3 [IU] via SUBCUTANEOUS

## 2014-08-07 MED ORDER — SODIUM CHLORIDE 0.9 % IV SOLN: INTRAVENOUS | Status: DC

## 2014-08-07 MED ORDER — PANTOPRAZOLE SODIUM 40 MG IV SOLR
40.0000 mg | Freq: Every day | INTRAVENOUS | Status: DC
  Administered 2014-08-07: 40 mg via INTRAVENOUS
  Filled 2014-08-07 (×2): qty 40

## 2014-08-07 MED ORDER — LABETALOL HCL 5 MG/ML IV SOLN
10.0000 mg | INTRAVENOUS | Status: DC | PRN
Start: 2014-08-07 — End: 2014-08-07

## 2014-08-07 NOTE — Procedures (Signed)
S/P Rt  Common carotid arteriogram,followed by complete revascularization of occluded RT ICA terminus and and RT MCA occlusion,and Rt ACAA1 and partially A2 segment using 9 mg of superselective  IA Tpa and x 3 passes with Solitaire FR 4mm x 40 mm  Stent retrieval device.

## 2014-08-07 NOTE — Progress Notes (Signed)
Patient transported to MRI without any complications.

## 2014-08-07 NOTE — Progress Notes (Signed)
SLP Cancellation Note  Patient Details Name: Monica [name] MRN: [id number] DOB: 10-02-1929   Cancelled treatment:       Reason Eval/Treat Not Completed: Medical issues which prohibited therapy. Pt intubated    Monica [name], [name] 08/07/2014, 8:05 AM

## 2014-08-07 NOTE — Progress Notes (Signed)
PT Cancellation Note  Patient Details Name: Monica [name] MRN: [id number] DOB: 02/13/1929   Cancelled Treatment:    Reason Eval/Treat Not Completed: Medical issues which prohibited therapy -- pt currently on 6 hours bedrest following IR procedure, and order to start therapy 10/19.  Will return next date.   Narda AmberMartin, Francia Verry [facility] 08/07/2014, 4:44 PM

## 2014-08-07 NOTE — Progress Notes (Signed)
STROKE TEAM PROGRESS NOTE   HISTORY Monica [name] is an 80 y.o. female with history of dementia presenting as code stroke for AMS and left sided symptoms concerning for a R MCA infarct. Per EMS patient was in normal state of health, at 1940 was in the shower and suddenly slumped over and became unresponsive. EMS noted right gaze preference and flaccid LUE. BS normal with BP 140/98 upon arrival. Upon arrival patient moans to noxious stimuli but otherwise non verbal. Per family and EMS notes patient has baseline dementia but can hold a conversation and is appropriately interactive. Called her husband to discuss findings and my concern for a right MCA infarct. Offered him option of IV tPA and possible IR treatment. Counseled on benefits and risks of the procedures. He expressed understanding and stated, "do what needs to be done". Discussed CT head results with radiology, Dr Constance Goltzlson, shows no ICH, no evidence of large acute infarct. Case discussed with IR Dr Corliss Skainseveshwar, will proceed to IR after tPA. Patient intubated for airway protection. Received 10mg  labetalol prior to tPA with BP down to 180/84 prior to tPA admission.  Date last known well: 08/07/2014  Time last known well: 1940  tPA Given: yes, started 2107 for suspected right MCA infarct  NIHSS: 25 in ED  SUBJECTIVE (INTERVAL HISTORY) She had thrombectomy by IR overnight and kept intubated. Repeat CT head after procedure showed left hemisphere swelling. Had MRI during the day showed large right MCA stroke with mass effect. Poor prognosis.  Talked with daughter and son in law in the hallway. Showed them images. They told me that pt at baseline suffered from dementia with behavior disturbance, recently undergoing medication adjustment for behavior issues. She is not able to do ADLs at baseline. She needs help with ADLs at home. Discussed with them regarding the poor prognosis and options in taking care of her at this time. Daughter wants to talk about  pt's husband and pt's son before making any decisions. Currently full code.   OBJECTIVE Temp:  [93.9 F (34.4 C)-99 F (37.2 C)] 99 F (37.2 C) (10/18 1400) Pulse Rate:  [47-90] 76 (10/18 1400) Cardiac Rhythm:  [-] Normal sinus rhythm (10/18 0800) Resp:  [10-30] 24 (10/18 1400) BP: (79-221)/(35-100) 90/50 mmHg (10/18 1400) SpO2:  [94 %-100 %] 100 % (10/18 1400) Arterial Line BP: (68-175)/(36-89) 77/65 mmHg (10/18 1400) FiO2 (%):  [40 %-100 %] 40 % (10/18 1400) Weight:  [166 lb 3.6 oz (75.4 kg)-167 lb 8.8 oz (76 kg)] 166 lb 3.6 oz (75.4 kg) (10/18 0200)   Recent Labs Lab 08/05/2014 2040 08/07/14 0240 08/07/14 0812 08/07/14 1337  GLUCAP 114* 123* 98 95    Recent Labs Lab 08/09/2014 2013 08/19/2014 2018 08/07/14 0355  NA 139 142 141  K 3.8 3.7 3.4*  CL 100 102 107  CO2 28  --  22  GLUCOSE 125* 124* 167*  BUN 17 16 14   CREATININE 0.88 0.90 0.67  CALCIUM 8.9  --  7.5*    Recent Labs Lab 07/26/2014 2013  AST 26  ALT 16  ALKPHOS 70  BILITOT 0.5  PROT 7.3  ALBUMIN 3.4*    Recent Labs Lab 07/27/2014 2013 08/14/2014 2018 08/07/14 0355  WBC 5.5  --  6.2  NEUTROABS 2.6  --  4.3  HGB 14.6 15.6* 12.3  HCT 42.7 46.0 36.0  MCV 89.7  --  88.7  PLT 129*  --  126*   No results found for this basename: CKTOTAL, CKMB, CKMBINDEX, TROPONINI,  in the last 168 hours  Recent Labs  08/15/2014 2013  LABPROT 13.9  INR 1.06    Recent Labs  08/13/2014 2104  COLORURINE YELLOW  LABSPEC 1.012  PHURINE 7.5  GLUCOSEU NEGATIVE  HGBUR TRACE*  BILIRUBINUR NEGATIVE  KETONESUR 15*  PROTEINUR NEGATIVE  UROBILINOGEN 1.0  NITRITE NEGATIVE  LEUKOCYTESUR NEGATIVE       Component Value Date/Time   CHOL 117 08/07/2014 0356   TRIG 96 08/07/2014 0356   TRIG 95 08/07/2014 0356   HDL 31* 08/07/2014 0356   CHOLHDL 3.8 08/07/2014 0356   VLDL 19 08/07/2014 0356   LDLCALC 67 08/07/2014 0356   No results found for this basename: HGBA1C      Component Value Date/Time   LABOPIA NONE  DETECTED 07/30/2014 2104   COCAINSCRNUR NONE DETECTED 07/21/2014 2104   LABBENZ NONE DETECTED 08/17/2014 2104   AMPHETMU NONE DETECTED 07/29/2014 2104   THCU NONE DETECTED 08/15/2014 2104   LABBARB NONE DETECTED 07/25/2014 2104     Recent Labs Lab 07/23/2014 2013  ETH <11    Ct Head Wo Contrast 08/07/2014   IMPRESSION: Interval change with findings suggesting developing large right middle cerebral artery infarct with mild local mass effect.  Minimal hyper density of the right base ganglia region and may represent result of recent contrast injection versus minimal petechial hemorrhage.  Patient is at risk for development of significant mass effect as this infarct progresses and close follow-up imaging recommended.     07/29/2014   IMPRESSION: No intracranial hemorrhage.  Small vessel disease type changes without CT evidence of large acute infarct. The slightly dense appearance of the M1 segment of the middle cerebral arteries appears symmetric and therefore not able to conclude that there is a thrombus in the M1 segment of the right middle cerebral artery.    Mr Brain Wo Contrast 08/07/2014   IMPRESSION: Acute infarction throughout the right middle cerebral artery territory and the right PCA territory (fetal origin right PCA). Mild swelling with petechial blood products but no frank hematoma. Right-to-left shift of 2 mm.  MR angiography shows wide patency of the vessels presently, with luxury perfusion in the right MCA and PCA territories.    Portable Chest Xray 08/07/2014   IMPRESSION: Endotracheal tube reposition now with the tip 4.2 cm above the carina.    Dg Chest Portable 1 View 07/24/2014   IMPRESSION: 1. Endotracheal tube tip in the right mainstem bronchus. It is recommended that this be retracted 4.5 cm. 2. Stable cardiomegaly and chronic interstitial lung disease. 3. Abnormally positioned possible left femoral vein catheter. This may be within a duplicated inferior vena cava and  extending into the more right-sided inferior vena cava or not within a central vein.     Dg Abd Portable 1v 07/28/2014    IMPRESSION: Orogastric tube is coiled in the stomach, with tip in the body/antrum.      PHYSICAL EXAM  Temp:  [93.9 F (34.4 C)-99 F (37.2 C)] 99 F (37.2 C) (10/18 1400) Pulse Rate:  [47-90] 76 (10/18 1400) Resp:  [10-30] 24 (10/18 1400) BP: (79-221)/(35-100) 90/50 mmHg (10/18 1400) SpO2:  [94 %-100 %] 100 % (10/18 1400) Arterial Line BP: (68-175)/(36-89) 77/65 mmHg (10/18 1400) FiO2 (%):  [40 %-100 %] 40 % (10/18 1400) Weight:  [166 lb 3.6 oz (75.4 kg)-167 lb 8.8 oz (76 kg)] 166 lb 3.6 oz (75.4 kg) (10/18 0200)  General - Well nourished, well developed, intubated on vent.  Ophthalmologic - not able to see  through.  Cardiovascular - Regular rate and rhythm with no murmur with occasional premature beat.  Neuro - intubated and sedated on vent, once sedation off, she is agitated and spontaneously moving on the right, especially the arm with purposeful movement. PEERL, eyes deviated to right, doll's eye present, corneal and gag present, no nystagmus, right facial droop obvious even with ET tapes. Left UE 0/5, left LE 2/5 on pain stimulation, RLE 3/5 on pain stimulation, RUE purposeful movement. Left babinski positive.   ASSESSMENT/PLAN Ms. Monica Cosnnette Stach is a 80 y.o. female with history of dementia with behavior disturbance not able to do ADLs at baseline and heart murmur presenting with acute onset AMS and left sided weakness with right eye gaze. She received tPA and underwent IR with recannulization. However, post procedure CT and MRI showed large right MCA and PCA (fetal PCA) infarct with mass effect. Currently kept intubated.  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, embolic pattern, source unclear but suspect PAF    MRI  Large right MCA and PCA infarct with mass effect  MRA  luxury perfusion on the right hemisphere   Carotid Doppler   pending  2D Echo  pending  LDL 67, meet the goal < 70  HgbA1c pending  SCDs for VTE prophylaxis  NPO  Bedrest  no antithrombotics prior to admission, now on no thrombotics due to tPA  Resultant left hemiplegia, intubation, right gaze and AMS  Therapy recommendations:  pending  Disposition:  Pending  Discussed with daughter and son in law in length and showed them images and informed them very poor prognosis. They would like to discuss with pt's husband and son before making any further decision. Currently still full code.  ? PAF - large embolic stroke in elderly - frequent PACs on tele - continue monitor  Hypertension  On admission 180/88  Home meds:   none  Intermittent hypotension  Permissive hypertension (OK if < 220/120) but gradually normalize in 5-7 days  Avoid hypotension  Hyperlipidemia  Home meds:  none   LDL 67  Meet the goal of < 70  Diabetes  HgbA1c pending goal < 7.0  CBG WNL  Controlled  Other Stroke Risk Factors Advanced age   Other Active Problems  Respiratory failure on vent  Other Pertinent History  Dementia on aricept  Agitation on sertraline  Hospital day # 1  This patient is critically ill due to large right hemisphere stroke, respiratory failure and at significant risk of neurological worsening, death form brain herniation and cerebral edema. This patient's care requires constant monitoring of vital signs, hemodynamics, respiratory and cardiac monitoring, review of multiple databases, neurological assessment, discussion with family, other specialists and medical decision making of high complexity. I spent 50 minutes of neurocritical care time in the care of this patient.   Marvel PlanJindong Daichi Moris, MD PhD Stroke Neurology 08/07/2014 3:46 PM   To contact Stroke Continuity provider, please refer to [email or web address]. After hours, contact General Neurology

## 2014-08-07 NOTE — Progress Notes (Signed)
Chaplain responded to request by Radiology to offer pastoral support to the  husband of the patient who was a code stroke patient. He was here at the hospital alone waiting for his wife who was having a medical procedure. Several attempts were made to contact his daughter to no avail, and a message was left informing her of her mothers admission to the hospital. Comfort measures were extended, as well as the ministry of listening and encouragement.  He was excorted up to [facility] to the patients assigned Unit.  Chaplain will follow-up at a later time and refer to assigned Unit Chaplain

## 2014-08-07 NOTE — ED Provider Notes (Signed)
Medical screening examination/treatment/procedure(s) were conducted as a shared visit with non-physician practitioner(s) or resident  and myself.  I personally evaluated the patient during the encounter and agree with the findings.   I have personally reviewed any xrays and/ or EKG's with the provider and I agree with interpretation.   Patient presented code stroke. Difficult exam to 2 stroke symptoms and altered mental, patient moaning, clinically not protecting her airway. Bradycardic, significant weakness left side of her body, left facial droop and right eye deviation. Patient's symptoms worsened and did not improve in the ER, patient intubated for airway protection and further treatment. Neurology spoke with family and this time patient has full code. CRITICAL CARE Performed by: Enid SkeensZAVITZ, Jaceion Aday M   Total critical care time: 35 min  Critical care time was exclusive of separately billable procedures and treating other patients.  Critical care was necessary to treat or prevent imminent or life-threatening deterioration.  Critical care was time spent personally by me on the following activities: development of treatment plan with patient and/or surrogate as well as nursing, discussions with consultants, evaluation of patient's response to treatment, examination of patient, obtaining history from patient or surrogate, ordering and performing treatments and interventions, ordering and review of laboratory studies, ordering and review of radiographic studies, pulse oximetry and re-evaluation of patient's condition.  I was present for intubation performed by the resident.  The patients results and plan were reviewed and discussed.   Any x-rays performed were personally reviewed by myself.   Differential diagnosis were considered with the presenting HPI.  Medications  lidocaine (cardiac) 100 mg/285ml (XYLOCAINE) 20 MG/ML injection 2% (  Not Given 07/27/2014 2050)  rocuronium (ZEMURON) 50 MG/5ML  injection (  Not Given 08/20/2014 2050)  labetalol (NORMODYNE,TRANDATE) injection 10 mg (not administered)  lidocaine (XYLOCAINE) 1 % (with pres) injection (not administered)  nitroGLYCERIN 100 MCG/ML intra-arterial injection (not administered)  alteplase (ACTIVASE) 30 mg/30 mL- FOR IR (10 mg Intra-arterial Given 08/07/14 0009)   stroke: mapping our early stages of recovery book (not administered)  0.9 %  sodium chloride infusion (not administered)  acetaminophen (TYLENOL) tablet 650 mg (not administered)    Or  acetaminophen (TYLENOL) suppository 650 mg (not administered)  senna-docusate (Senokot-S) tablet 1 tablet (not administered)  labetalol (NORMODYNE,TRANDATE) injection 10 mg (not administered)  pantoprazole (PROTONIX) injection 40 mg (not administered)  succinylcholine (ANECTINE) 20 MG/ML injection (80 mg  Given  2046)  etomidate (AMIDATE) 2 MG/ML injection (15 mg  Given 07/22/2014 2046)  alteplase (ACTIVASE) 1 mg/mL infusion 69 mg (69 mg Intravenous Transfusing/Transfer 08/02/2014 2140)  0.9 %  sodium chloride infusion (0 mLs Intravenous Stopped 08/09/2014 2139)  labetalol (NORMODYNE,TRANDATE) injection 10 mg (10 mg Intravenous Given 07/31/2014 2102)  propofol (DIPRIVAN) 10 mg/ml infusion (  Transfusing/Transfer 08/19/2014 2139)  iohexol (OMNIPAQUE) 300 MG/ML solution 125 mL (125 mLs Intravenous Contrast Given 07/27/2014 2128)    Filed Vitals:   08/15/2014 2125 08/01/2014 2130  2156 07/21/2014 2159  BP:  151/64    Pulse:  65    Temp:  97.3 F (36.3 C)  97.3 F (36.3 C)  Resp:  19    Height:   5\' 7"  (1.702 m)   Weight:   167 lb 8.8 oz (76 kg)   SpO2: 99% 98%      Final diagnoses:  CVA (cerebral infarction)  Stroke  Cerebral infarction due to embolism of right carotid artery     Enid SkeensJoshua M Siaosi Alter, MD 08/07/14 [phone]

## 2014-08-07 NOTE — Consult Note (Signed)
PULMONARY / CRITICAL CARE MEDICINE   Name: Monica [name] MRN: [id number] DOB: 1929-03-11    ADMISSION DATE:  01/01/2014 CONSULTATION DATE:  10/18  REFERRING MD :  Stroke  INITIAL PRESENTATION:  18F code stroke went directly from ED to IR for intra-arterial tPA with good revascularization. Intubated for procedure. PCCM asked to assist with vent/ICU management  STUDIES/SIGNIFICANT EVENTS:  10/17 CT head: No intracranial hemorrhage. Small vessel disease type changes without CT evidence of large acute infarct. The slightly dense appearance of the M1 segment of the middle cerebral arteries appears symmetric and therefore not able to conclude that there is a thrombus in the M1 segment of the right middle cerebral artery 10/17 Neuro IR: R common carotid arteriogram, followed by complete revascularization of occluded R ICA terminus and and R MCA occlusion,and R ACAA1 and partially A2 segment using 9 mg of superselective IA tPA 10/18 CT head: Interval change with findings suggesting developing large right middle cerebral artery infarct with mild local mass effect 10/18 MRI/MRA brain: Acute infarction throughout the right middle cerebral artery territory and the right PCA territory (fetal origin right PCA). Mild swelling with petechial blood products but no frank hematoma. Right-to-left shift of 2 mm. MR angiography shows wide patency of the vessels presently 10/18 Carotid Duplex:  10/18 TTE:   INDWELLING DEVICES:: ETT 10/17 >>  L radial A line 10/17 >>   MICRO DATA:   ANTIMICROBIALS:     HISTORY OF PRESENT ILLNESS:   As above  PAST MEDICAL HISTORY :   has a past medical history of Dementia with Lewy bodies; Dementia, unspecified, with behavioral disturbance; Diabetes; HTN (hypertension); Anxiety; and Dementia with behavioral disturbance (04/15/2013).  has past surgical history that includes Back surgery. Prior to Admission medications   Medication Sig Start Date End Date Taking?  Authorizing Provider  ALPRAZolam Prudy Feeler(XANAX) 0.5 MG tablet Take 0.5 mg by mouth 2 (two) times daily. SCHEDULED 0800 and 1600   Yes Historical Provider, MD  ALPRAZolam (XANAX) 0.5 MG tablet Take 0.5 mg by mouth 2 (two) times daily as needed for anxiety. May give within 1 hour of scheduled dose for increased anxiety or agitation, if needed   Yes Historical Provider, MD  cholecalciferol (VITAMIN D) 1000 UNITS tablet Take 1,000 Units by mouth daily.   Yes Historical Provider, MD  donepezil (ARICEPT) 10 MG tablet Take 10 mg by mouth at bedtime.   Yes Historical Provider, MD  Melatonin 5 MG TABS Take 5 mg by mouth at bedtime.   Yes Historical Provider, MD  Multiple Vitamins-Minerals (MULTIVITAMIN PO) Take 1 tablet by mouth daily.   Yes Historical Provider, MD  naproxen sodium (ANAPROX) 220 MG tablet Take 220 mg by mouth 2 (two) times daily as needed (for pain).   Yes Historical Provider, MD  polyethylene glycol (MIRALAX / GLYCOLAX) packet Take 17 g by mouth daily as needed for mild constipation.   Yes Historical Provider, MD  sertraline (ZOLOFT) 25 MG tablet Take 25 mg by mouth at bedtime.   Yes Historical Provider, MD   Allergies  Allergen Reactions  . Asa [Aspirin] Other (See Comments)    Unknown; not listed on nursing home MAR  . Depakote [Divalproex Sodium] Other (See Comments)    Listed on MAR; unknown reason  . Lorazepam Other (See Comments)    Listed on MAR; unknown reason  . Risperidone And Related Other (See Comments)    Listed on MAR; unknown reason    FAMILY HISTORY:  indicated that her mother is  deceased. She indicated that her father is deceased.  SOCIAL HISTORY:  reports that she has never smoked. She has never used smokeless tobacco. She reports that she does not drink alcohol or use illicit drugs.  REVIEW OF SYSTEMS:  N/A  SUBJECTIVE:  L hemiplegia, not F/C. Tolerates PS 10 cm H2O  VITAL SIGNS: Temp:  [93.9 F (34.4 C)-98.9 F (37.2 C)] 98.9 F (37.2 C) (10/18 1300) Pulse  Rate:  [47-90] 47 (10/18 1317) Resp:  [10-30] 14 (10/18 1317) BP: (79-221)/(35-100) 86/45 mmHg (10/18 1317) SpO2:  [94 %-100 %] 100 % (10/18 1317) Arterial Line BP: (68-175)/(36-89) 100/44 mmHg (10/18 1000) FiO2 (%):  [40 %-100 %] 40 % (10/18 1317) Weight:  [75.4 kg (166 lb 3.6 oz)-76 kg (167 lb 8.8 oz)] 75.4 kg (166 lb 3.6 oz) (10/18 0200) HEMODYNAMICS:   VENTILATOR SETTINGS: Vent Mode:  [-] PRVC FiO2 (%):  [40 %-100 %] 40 % Set Rate:  [10 bmp-14 bmp] 14 bmp Vt Set:  [500 mL] 500 mL PEEP:  [5 cmH20] 5 cmH20 Pressure Support:  [10 cmH20] 10 cmH20 Plateau Pressure:  [15 cmH20-19 cmH20] 18 cmH20 INTAKE / OUTPUT:  Intake/Output Summary (Last 24 hours) at 08/07/14 1429 Last data filed at 08/07/14 1300  Gross per 24 hour  Intake 2991.02 ml  Output   1585 ml  Net 1406.02 ml    PHYSICAL EXAMINATION: General:  Elderly, intubated, RASS -3, -4 Neuro:  Dense L hemiplegia HEENT: NCAT, PERRL Cardiovascular: reg, no M Lungs:  Clear Abdomen:  Soft, +BS Ext: warm, no edema  LABS: I have reviewed all of today's lab results. Relevant abnormalities are discussed in the A/P section  CXR: NACPD  ASSESSMENT / PLAN:  PULMONARY A: Acute resp failure due to AMS after large CVA P:   Cont full vent support - settings reviewed and/or adjusted Cont vent bundle Daily SBT if/when meets criteria  CARDIOVASCULAR A:  No issues P:  Monitor Avoid hypotension  RENAL A:   Mild hypokalemia P:   Monitor BMET intermittently Monitor I/Os Correct electrolytes as indicated  GASTROINTESTINAL A:  No issues P:   SUP: IV PPI Begin TFs 10/18  HEMATOLOGIC A:   No issues P:  DVT px: SCDs Monitor CBC intermittently Transfuse per usual ICU guidelines  INFECTIOUS A:   No issues P:   Micro and abx as above  ENDOCRINE A:   DM 2 P:   SSI ordered 10/18  NEUROLOGIC A:   Large R hemisphere CVA Dementia Chronic use of BZDs, SSRI P:   RASS goal: 0,-1 Cont propofol Holding  alprazolam, sertraline  Family updated:    Interdisciplinary Family Meeting:    TODAY'S SUMMARY:   45 mins CCM time  Billy Fischeravid Simonds, MD ; [facility] Mobile [phone].  After 5:30 PM or weekends, call [phone]  Pulmonary and Critical Care Medicine [facility] Pager: [phone]  08/07/2014, 2:29 PM

## 2014-08-07 NOTE — Progress Notes (Signed)
eLink Physician-Brief Progress Note Patient Name: Monica [name] DOB: 1929/01/06 MRN: [id number]   Date of Service  08/07/2014  HPI/Events of Note  Hypokalemia  eICU Interventions  Potassium replaced     Intervention Category Minor Interventions: Electrolytes abnormality - evaluation and management  DETERDING,ELIZABETH 08/07/2014, 5:39 AM

## 2014-08-07 NOTE — Anesthesia Postprocedure Evaluation (Signed)
  Anesthesia Post-op Note  Patient: Monica [name]  Procedure(s) Performed: Procedure(s): RADIOLOGY WITH ANESTHESIA (N/A)  Patient Location: NICU  Anesthesia Type:General  Level of Consciousness: sedated and Patient remains intubated per anesthesia plan  Airway and Oxygen Therapy: Patient remains intubated per anesthesia plan and Patient placed on Ventilator (see vital sign flow sheet for setting)  Post-op Pain: none  Post-op Assessment: Post-op Vital signs reviewed, Patient's Cardiovascular Status Stable, Respiratory Function Stable, Patent Airway and No signs of Nausea or vomiting  Post-op Vital Signs: stable  Last Vitals:  Filed Vitals:   07/28/2014 2159  BP:   Pulse:   Temp: 36.3 C  Resp:     Complications: No apparent anesthesia complications

## 2014-08-07 NOTE — Transfer of Care (Signed)
Immediate Anesthesia Transfer of Care Note  Patient: Monica [name]  Procedure(s) Performed: Procedure(s): RADIOLOGY WITH ANESTHESIA (N/A)  Patient Location: NICU  Anesthesia Type:General  Level of Consciousness: sedated and Patient remains intubated per anesthesia plan  Airway & Oxygen Therapy: Patient remains intubated per anesthesia plan and Patient placed on Ventilator (see vital sign flow sheet for setting)  Post-op Assessment: Report given to NICU RN , Vital Signs reviewed and stable .  Post vital signs: Reviewed and stable  Complications: No apparent anesthesia complications

## 2014-08-07 NOTE — Sedation Documentation (Signed)
109fr short sheath replaced in RFA.

## 2014-08-07 NOTE — Progress Notes (Signed)
Interventional Radiology here at bedside to remove 9Fr Rt groin sheath.  Pt id'ed by name and DOB.  Sheath removed at 1435, pressure applied for 25mins until hemostasis achieved.  V-Pad applied over incision, along with pressure dressing and 10lb sandbag.  Rt leg sheeted across knee.  RN Herbert SetaHeather at bedside to receive report.  No immediate complications noted. Distal pulses DP+2, PT not detected.  ccd/jkc

## 2014-08-08 ENCOUNTER — Inpatient Hospital Stay (HOSPITAL_COMMUNITY): Payer: Medicare Other

## 2014-08-08 DIAGNOSIS — I6309 Cerebral infarction due to thrombosis of other precerebral artery: Secondary | ICD-10-CM

## 2014-08-08 DIAGNOSIS — I059 Rheumatic mitral valve disease, unspecified: Secondary | ICD-10-CM

## 2014-08-08 DIAGNOSIS — I63411 Cerebral infarction due to embolism of right middle cerebral artery: Principal | ICD-10-CM

## 2014-08-08 DIAGNOSIS — I63031 Cerebral infarction due to thrombosis of right carotid artery: Secondary | ICD-10-CM

## 2014-08-08 LAB — COMPREHENSIVE METABOLIC PANEL
ALBUMIN: 2.6 g/dL — AB (ref 3.5–5.2)
ALT: 11 U/L (ref 0–35)
AST: 26 U/L (ref 0–37)
Alkaline Phosphatase: 49 U/L (ref 39–117)
Anion gap: 11 (ref 5–15)
BILIRUBIN TOTAL: 0.5 mg/dL (ref 0.3–1.2)
BUN: 9 mg/dL (ref 6–23)
CHLORIDE: 109 meq/L (ref 96–112)
CO2: 21 meq/L (ref 19–32)
Calcium: 7.6 mg/dL — ABNORMAL LOW (ref 8.4–10.5)
Creatinine, Ser: 0.61 mg/dL (ref 0.50–1.10)
GFR calc Af Amer: >90 mL/min (ref >90)
GFR, EST NON AFRICAN AMERICAN: 82 mL/min — AB (ref >90)
Glucose, Bld: 140 mg/dL — ABNORMAL HIGH (ref 70–99)
Potassium: 4 mEq/L (ref 3.7–5.3)
Sodium: 141 mEq/L (ref 137–147)
Total Protein: 5.8 g/dL — ABNORMAL LOW (ref 6.0–8.3)

## 2014-08-08 LAB — GLUCOSE, CAPILLARY
GLUCOSE-CAPILLARY: 117 mg/dL — AB (ref 70–99)
GLUCOSE-CAPILLARY: 127 mg/dL — AB (ref 70–99)
GLUCOSE-CAPILLARY: 147 mg/dL — AB (ref 70–99)
Glucose-Capillary: 107 mg/dL — ABNORMAL HIGH (ref 70–99)
Glucose-Capillary: 119 mg/dL — ABNORMAL HIGH (ref 70–99)
Glucose-Capillary: 128 mg/dL — ABNORMAL HIGH (ref 70–99)
Glucose-Capillary: 134 mg/dL — ABNORMAL HIGH (ref 70–99)

## 2014-08-08 MED ORDER — FENTANYL CITRATE 0.05 MG/ML IJ SOLN
25.0000 ug | INTRAMUSCULAR | Status: DC | PRN
  Administered 2014-08-08 – 2014-08-09 (×3): 50 ug via INTRAVENOUS
  Administered 2014-08-10 (×2): 100 ug via INTRAVENOUS
  Filled 2014-08-08 (×6): qty 2

## 2014-08-08 MED ORDER — PRO-STAT SUGAR FREE PO LIQD
30.0000 mL | Freq: Two times a day (BID) | ORAL | Status: DC
  Administered 2014-08-08 – 2014-08-10 (×5): 30 mL
  Filled 2014-08-08 (×6): qty 30

## 2014-08-08 MED ORDER — PANTOPRAZOLE SODIUM 40 MG PO PACK
40.0000 mg | PACK | Freq: Every day | ORAL | Status: DC
  Administered 2014-08-08 – 2014-08-10 (×3): 40 mg
  Filled 2014-08-08 (×3): qty 20

## 2014-08-08 MED ORDER — ADULT MULTIVITAMIN LIQUID CH
5.0000 mL | Freq: Every day | ORAL | Status: DC
  Administered 2014-08-08 – 2014-08-10 (×3): 5 mL
  Filled 2014-08-08 (×3): qty 5

## 2014-08-08 MED ORDER — VITAL HIGH PROTEIN PO LIQD
1000.0000 mL | ORAL | Status: DC
  Administered 2014-08-08 – 2014-08-09 (×2): 1000 mL
  Administered 2014-08-09 (×2)
  Administered 2014-08-09: 1000 mL
  Filled 2014-08-08 (×4): qty 1000

## 2014-08-08 NOTE — Progress Notes (Signed)
Echocardiogram 2D Echocardiogram has been performed.  Monica [name] 08/08/2014, 1:58 PM

## 2014-08-08 NOTE — Progress Notes (Signed)
PT Cancellation Note  Patient Details Name: Monica [name] MRN: [id number] DOB: 22-Mar-1929   Cancelled Treatment:    Reason Eval/Treat Not Completed: Patient not medically ready (on bedrest at this time)   Fabio AsaWerner, Rahil Passey J 08/08/2014, 8:46 AM Charlotte Crumbevon Florance Paolillo, PT DPT  [phone]

## 2014-08-08 NOTE — Progress Notes (Signed)
VASCULAR LAB PRELIMINARY  PRELIMINARY  PRELIMINARY  PRELIMINARY  Carotid duplex  completed.    Preliminary report:  Bilateral:  1-39% ICA stenosis.  Vertebral artery flow is antegrade.      Kenasia Scheller, RVT 08/08/2014, 3:37 PM

## 2014-08-08 NOTE — Progress Notes (Signed)
OT Cancellation Note  Patient Details Name: Maylon Cosnnette Victorio MRN: [id number] DOB: 03/02/1929   Cancelled Treatment:    Reason Eval/Treat Not Completed: Patient not medically ready On bedrest. Will assess if appropriate. [facility] Phys[facility] CenterWARD,HILLARY Ryland Smoots, OTR/L  [phone] 08/08/2014 08/08/2014, 1:24 PM

## 2014-08-08 NOTE — Progress Notes (Signed)
PULMONARY / CRITICAL CARE MEDICINE   Name: Monica [name] MRN: [id number] DOB: Apr 05, 1929    ADMISSION DATE:  Dec 03, 2013 CONSULTATION DATE:  10/18  REFERRING MD :  Stroke  INITIAL PRESENTATION:  87F code stroke went directly from ED to IR for intra-arterial tPA with good revascularization. Intubated for procedure. PCCM asked to assist with vent/ICU management  STUDIES/SIGNIFICANT EVENTS:  10/17 CT head: No intracranial hemorrhage. Small vessel disease type changes without CT evidence of large acute infarct. The slightly dense appearance of the M1 segment of the middle cerebral arteries appears symmetric and therefore not able to conclude that there is a thrombus in the M1 segment of the right middle cerebral artery 10/17 Neuro IR: R common carotid arteriogram, followed by complete revascularization of occluded R ICA terminus and and R MCA occlusion,and R ACAA1 and partially A2 segment using 9 mg of superselective IA tPA 10/18 CT head: Interval change with findings suggesting developing large right middle cerebral artery infarct with mild local mass effect 10/18 MRI/MRA brain: Acute infarction throughout the right middle cerebral artery territory and the right PCA territory (fetal origin right PCA). Mild swelling with petechial blood products but no frank hematoma. Right-to-left shift of 2 mm. MR angiography shows wide patency of the vessels presently 10/18 Carotid Duplex:  10/18 TTE:   INDWELLING DEVICES:: ETT 10/17 >>  L radial A line 10/17 >>   MICRO DATA:   ANTIMICROBIALS:     SUBJECTIVE:  Febrile No obvious pain RASS 0  VITAL SIGNS: Temp:  [98.4 F (36.9 C)-100.9 F (38.3 C)] 100.8 F (38.2 C) (10/19 0702) Pulse Rate:  [47-76] 68 (10/19 0823) Resp:  [10-27] 25 (10/19 0823) BP: (84-138)/(29-80) 109/59 mmHg (10/19 0823) SpO2:  [100 %] 100 % (10/19 0823) Arterial Line BP: (52-176)/(36-84) 52/36 mmHg (10/19 0702) FiO2 (%):  [40 %] 40 % (10/19 0823) HEMODYNAMICS:    VENTILATOR SETTINGS: Vent Mode:  [-] CPAP;PSV FiO2 (%):  [40 %] 40 % Set Rate:  [14 bmp] 14 bmp Vt Set:  [500 mL] 500 mL PEEP:  [5 cmH20] 5 cmH20 Pressure Support:  [12 cmH20] 12 cmH20 Plateau Pressure:  [18 cmH20-21 cmH20] 21 cmH20 INTAKE / OUTPUT:  Intake/Output Summary (Last 24 hours) at 08/08/14 0844 Last data filed at 08/08/14 0700  Gross per 24 hour  Intake 2270.48 ml  Output    830 ml  Net 1440.48 ml    PHYSICAL EXAMINATION: General:  Elderly, intubated, RASS 0 Neuro:  Dense L hemiplegia HEENT: NCAT, PERRL Cardiovascular: reg, no M Lungs:  Clear Abdomen:  Soft, +BS Ext: warm, no edema  LABS: I have reviewed all of today's lab results. Relevant abnormalities are discussed in the A/P section  CXR: lt basal atx  ASSESSMENT / PLAN:  PULMONARY A: Acute resp failure due to AMS after large CVA P:   Cont full vent support - settings reviewed and/or adjusted Cont vent bundle Daily SBT if/when meets criteria  CARDIOVASCULAR A:  No issues P:  Monitor Avoid hypotension TEE planned  RENAL A:  Oliguria Mild hypokalemia P:   Monitor BMET intermittently Monitor I/Os Correct electrolytes as indicated  GASTROINTESTINAL A:  No issues P:   SUP: IV PPI Begin TFs 10/18  HEMATOLOGIC A:   No issues P:  DVT px: SCDs Monitor CBC intermittently Transfuse per usual ICU guidelines  INFECTIOUS A:   No issues P:   Micro and abx as above  ENDOCRINE A:   DM 2 P:   SSI ordered 10/18  NEUROLOGIC A:  Large Rt MCA/PCA CVA Dementia Chronic use of BZDs, SSRI P:   RASS goal: 0 Cont propofol Holding alprazolam, sertraline    Family updated:    Interdisciplinary Family Meeting:    TODAY'S SUMMARY: - Dense left hemiplegia , guarded prognosis- await neuro prognostication prior to extubation  The patient is critically ill with multiple organ systems failure and requires high complexity decision making for assessment and support, frequent  evaluation and titration of therapies, application of advanced monitoring technologies and extensive interpretation of multiple databases. Critical Care Time devoted to patient care services described in this note is 35 minutes.   Cyril Mourningakesh Elimelech Houseman MD. Tonny BollmanFCCP. [facility] Pager [phone] If no response call [phone]      08/08/2014, 8:44 AM

## 2014-08-08 NOTE — Progress Notes (Signed)
INITIAL NUTRITION ASSESSMENT  DOCUMENTATION CODES Per approved criteria  -Obesity Unspecified   INTERVENTION: Decrease Vital High Protein to 35 ml/hr via OG tube  30 ml Prostat BID.    MVI daily  Tube feeding regimen provides 1040 kcal (66% of needs), 103 grams of protein, and 702 ml of H2O.   NUTRITION DIAGNOSIS: Inadequate oral intake related to inability to eat as evidenced by NPO status  Goal: Enteral nutrition to provide 60-70% of estimated calorie needs (22-25 kcals/kg ideal body weight) and 100% of estimated protein needs, based on ASPEN guidelines for hypocaloric, high protein feeding in critically ill obese individuals  Monitor:  Respiratory status, TF tolerance, weight trends  Reason for Assessment: Consult received to initiate and manage enteral nutrition support.  80 y.o. female  Admitting Dx: MCA Infarct  ASSESSMENT: Pt with hx of dementia admitted with large right MCA and PCA infarct. Per MD note pt with poor prognosis and plan to meet with family today.   Patient is currently intubated on ventilator support MV: 9.8 L/min Temp (24hrs), Avg:100.1 F (37.8 C), Min:98.8 F (37.1 C), Max:100.9 F (38.3 C)  Propofol: none  Per RN propofol off and will remain off, pt tolerating TF well.  Unable to complete nutrition-focused physical exam at this time. RN working on pt with family at bedside.  Vital High Protein at 40 ml/hr via OG tube.   Height: Ht Readings from Last 1 Encounters:  08/07/14 5\' 2"  (1.575 m)    Weight: Wt Readings from Last 1 Encounters:  08/07/14 166 lb 3.6 oz (75.4 kg)    Ideal Body Weight: 50 kg  % Ideal Body Weight: 151%  Wt Readings from Last 10 Encounters:  08/07/14 166 lb 3.6 oz (75.4 kg)  08/07/14 166 lb 3.6 oz (75.4 kg)  07/08/13 144 lb (65.318 kg)  06/29/13 143 lb (64.864 kg)  04/15/13 145 lb (65.772 kg)    Usual Body Weight: unknown  % Usual Body Weight: -  BMI:  Body mass index is 30.4 kg/(m^2).  Estimated  Nutritional Needs: Kcal: 1579 Protein: >/= 100 grams Fluid: > 1.5 L/day  Skin: WDL  Diet Order:    EDUCATION NEEDS: -No education needs identified at this time   Intake/Output Summary (Last 24 hours) at 08/08/14 1233 Last data filed at 08/08/14 1000  Gross per 24 hour  Intake 2188.63 ml  Output    770 ml  Net 1418.63 ml    Last BM: PTA   Labs:   Recent Labs Lab 04/19/2014 2013 04/19/2014 2018 08/07/14 0355 08/08/14 0444  NA 139 142 141 141  K 3.8 3.7 3.4* 4.0  CL 100 102 107 109  CO2 28  --  22 21  BUN 17 16 14 9   CREATININE 0.88 0.90 0.67 0.61  CALCIUM 8.9  --  7.5* 7.6*  GLUCOSE 125* 124* 167* 140*    CBG (last 3)   Recent Labs  08/08/14 0414 08/08/14 0831 08/08/14 1202  GLUCAP 119* 128* 107*    Scheduled Meds: .  stroke: mapping our early stages of recovery book   Does not apply Once  . antiseptic oral rinse  7 mL Mouth Rinse QID  . chlorhexidine  15 mL Mouth Rinse BID  . clopidogrel  75 mg Oral Daily  . feeding supplement (VITAL HIGH PROTEIN)  1,000 mL Per Tube Q24H  . insulin aspart  0-15 Units Subcutaneous 6 times per day  . pantoprazole sodium  40 mg Per Tube Q1200    Continuous  Infusions: . sodium chloride 10 mL/hr at 08/08/14 1000    Past Medical History  Diagnosis Date  . Dementia with Lewy bodies   . Dementia, unspecified, with behavioral disturbance   . Diabetes   . HTN (hypertension)   . Anxiety   . Dementia with behavioral disturbance 04/15/2013    Past Surgical History  Procedure Laterality Date  . Back surgery      Kendell BaneHeather Josefita Weissmann RD, LDN, CNSC [phone] Pager [phone] After Hours Pager

## 2014-08-08 NOTE — Progress Notes (Signed)
SLP Cancellation Note  Patient Details Name: Monica [name] MRN: [id number] DOB: 1928-11-12   Cancelled treatment:       Reason Eval/Treat Not Completed: Medical issues which prohibited therapy. Remains on vent. Will f/u.   Milton Streicher Meryl 08/08/2014, 8:20 AM

## 2014-08-08 NOTE — Progress Notes (Signed)
Chaplain followed up with family after another chaplain responded to family request for rabbi. Chaplain provided emotional support and communicated with staff the spiritual needs of the family. Chaplain will continue to follow,  08/08/14 1600  Clinical Encounter Type  Visited With Family;Health care provider  Visit Type Follow-up;Spiritual support  Consult/Referral To Faith community  Spiritual Encounters  Spiritual Needs Ritual;Emotional  Stress Factors  Patient Stress Factors Loss  Santino Kinsella, Mayer MaskerCourtney F, Chaplain 08/08/2014 4:40 PM

## 2014-08-08 NOTE — Progress Notes (Signed)
Subjective: Pt s/p Rt Common carotid arteriogram,followed by complete revascularization of occluded RT ICA terminus and and RT MCA occlusion,and Rt ACAA1 and partially A2 segment using 9 mg of superselective IA Tpa and x 3 passes with Solitaire FR 4mm x 40 mm Stent retrieval device.  Pt remains intubated/sedated  Objective: Physical Exam: BP 109/59  Pulse 68  Temp(Src) 100.8 F (38.2 C) (Core (Comment))  Resp 25  Ht 5\' 2"  (1.575 m)  Wt 166 lb 3.6 oz (75.4 kg)  BMI 30.40 kg/m2  SpO2 100% Intubated/sedated (R)groin soft, no hematoma Feet warm.   Labs: CBC  Recent Labs  08/10/2014 2013 08/19/2014 2018 08/07/14 0355  WBC 5.5  --  6.2  HGB 14.6 15.6* 12.3  HCT 42.7 46.0 36.0  PLT 129*  --  126*   BMET  Recent Labs  08/07/14 0355 08/08/14 0444  NA 141 141  K 3.4* 4.0  CL 107 109  CO2 22 21  GLUCOSE 167* 140*  BUN 14 9  CREATININE 0.67 0.61  CALCIUM 7.5* 7.6*   LFT  Recent Labs  08/08/14 0444  PROT 5.8*  ALBUMIN 2.6*  AST 26  ALT 11  ALKPHOS 49  BILITOT 0.5   PT/INR  Recent Labs  07/24/2014 2013  LABPROT 13.9  INR 1.06     Studies/Results: Ct Head Wo Contrast  08/07/2014   CLINICAL DATA:  80 year old female with diabetes and high blood pressure and dementia presenting with left-sided weakness. Post arteriogram. Subsequent encounter.  EXAM: CT HEAD WITHOUT CONTRAST  TECHNIQUE: Contiguous axial images were obtained from the base of the skull through the vertex without intravenous contrast.  COMPARISON:  07/31/2014.  FINDINGS: Interval change with findings suggesting developing large right middle cerebral artery infarct with mild local mass effect.  Minimal hyper density of the right base ganglia region and may represent result of recent contrast injection versus minimal petechial hemorrhage.  Dense right M1 segment middle cerebral artery may indicate thrombus or result of contrast injection.  Baseline atrophy without hydrocephalus.  IMPRESSION: Interval  change with findings suggesting developing large right middle cerebral artery infarct with mild local mass effect.  Minimal hyper density of the right base ganglia region and may represent result of recent contrast injection versus minimal petechial hemorrhage.  Patient is at risk for development of significant mass effect as this infarct progresses and close follow-up imaging recommended.  Call is into Dr. Corliss Skains.   Electronically Signed   By: Bridgett Larsson M.D.   On: 08/07/2014 01:21   Ct Head Wo Contrast  08/05/2014   CLINICAL DATA:  80 year old female with left-sided weakness in arms and legs. Severe left sided facial droop with gaze to the right. Diabetes, high blood pressure and dementia. Code stroke. Initial encounter.  EXAM: CT HEAD WITHOUT CONTRAST  TECHNIQUE: Contiguous axial images were obtained from the base of the skull through the vertex without intravenous contrast.  COMPARISON:  03/13/2014 CT.  FINDINGS: Head tilt and mild motion degradation slight limit evaluation.  No intracranial hemorrhage.  Small vessel disease type changes without CT evidence of large acute infarct. The slightly dense appearance of the M1 segment of the middle cerebral arteries appears symmetric and therefore not able to conclude that there is a thrombus in the M1 segment of the right middle cerebral artery.  Atrophy without hydrocephalus.  No intracranial mass lesion noted on this unenhanced exam.  Moderate mucosal thickening left maxillary sinus.  IMPRESSION: No intracranial hemorrhage.  Small vessel disease type changes without  CT evidence of large acute infarct. The slightly dense appearance of the M1 segment of the middle cerebral arteries appears symmetric and therefore not able to conclude that there is a thrombus in the M1 segment of the right middle cerebral artery.  These results were called by telephone at the time of interpretation on 07/29/2014 at 8:37 pm to Dr. Blane OharaJOSHUA ZAVITZ , who verbally acknowledged these  results.   Electronically Signed   By: Bridgett LarssonSteve  Olson M.D.   On: 08/17/2014 20:47   Mr Brain Wo Contrast  08/07/2014   CLINICAL DATA:  Subsequent encounter. Presentation on 07/28/2014 as code stroke. Patient treated with IV tPA and arterial intervention. Left-sided weakness. Unresponsive.  EXAM: MRI HEAD WITHOUT CONTRAST  MRA HEAD WITHOUT CONTRAST  TECHNIQUE: Multiplanar, multiecho pulse sequences of the brain and surrounding structures were obtained without intravenous contrast. Angiographic images of the head were obtained using MRA technique without contrast.  COMPARISON:  CT earlier same day.  Multiple previous examinations.  FINDINGS: MRI HEAD FINDINGS  Diffusion imaging shows acute infarction affecting the entire right temporal and occipital region, the right basal ganglia in the right posterior frontal and parietal region consistent with complete right MCA territory distribution infarction, with fetal origin of the right PCA. The brain shows swelling with 2 mm of right-to-left midline shift. There are low level petechial blood products in the region of the infarction. No frank hematoma. No other vascular distribution acute infarction. Remainder the brain shows only minimal chronic small vessel change of the hemispheric white matter. No hydrocephalus. No extra-axial collection.  MRA HEAD FINDINGS  Both internal carotid arteries are widely patent into the brain. The anterior and middle cerebral vessels show flow bilaterally, possibly with luxury perfusion evident in the right middle cerebral artery and posterior cerebral artery territory. No residual filling defect. No underlying vascular stenosis.  Both vertebral arteries are patent with the right being dominant. No basilar stenosis. Basilar artery terminates and superior cerebellar arteries bilaterally, with fetal origin of both posterior cerebral arteries.  IMPRESSION: Acute infarction throughout the right middle cerebral artery territory and the right PCA  territory (fetal origin right PCA). Mild swelling with petechial blood products but no frank hematoma. Right-to-left shift of 2 mm.  MR angiography shows wide patency of the vessels presently, with luxury perfusion in the right MCA and PCA territories.   Electronically Signed   By: Paulina FusiMark  Shogry M.D.   On: 08/07/2014 13:42   Dg Chest Port 1 View  08/08/2014   CLINICAL DATA:  Respiratory failure.  Routine ICU chest radiograph.  EXAM: PORTABLE CHEST - 1 VIEW  COMPARISON:  08/07/2014  FINDINGS: Support apparatus: Unchanged.  Cardiomediastinal Silhouette:  Unchanged.  Lungs: Lower lung volumes than on prior with increasing LEFT basilar atelectasis. Elevation of the RIGHT hemidiaphragm associated with low volumes. Increasing opacity at the LEFT lung base is most compatible with atelectasis superimposed on pulmonary parenchymal scarring No pneumothorax.  Effusions:  None.  Other:  Monitoring leads project over the chest.  IMPRESSION: 1. Unchanged support apparatus. 2. Lower lung volumes with increasing LEFT-greater-than- RIGHT basilar atelectasis. 3. Chronic pulmonary parenchymal scarring.   Electronically Signed   By: Andreas NewportGeoffrey  Lamke M.D.   On: 08/08/2014 07:29   Portable Chest Xray  08/07/2014   CLINICAL DATA:  80 year old with left-sided weakness. Endotracheal tube placement. Subsequent encounter.  EXAM: PORTABLE CHEST - 1 VIEW  COMPARISON:  08/07/2014.  FINDINGS: Endotracheal tube has been retracted.  Tip 4.2 cm above the carina.  Nasogastric tube  curled within the stomach.  Heart slightly enlarged.  Mild central pulmonary vascular prominence.  No segmental infiltrate or gross pneumothorax.  Calcified aorta.  IMPRESSION: Endotracheal tube reposition now with the tip 4.2 cm above the carina.   Electronically Signed   By: Bridgett Larsson M.D.   On: 08/07/2014 02:30   Dg Chest Portable 1 View  08/02/2014   CLINICAL DATA:  Code stroke.  Intubated and orogastric tube placed.  EXAM: PORTABLE CHEST - 1 VIEW   COMPARISON:  03/13/2014.  FINDINGS: The endotracheal tube tip is in the right mainstem bronchus. Grossly stable enlarged cardiac silhouette and prominent interstitial markings. Diffuse osteopenia. Nasogastric tube coiled in the proximal stomach with its tip most likely in the distal stomach, not included. Possible femoral vein catheter extending from the left lower lumbar spine region to the midline with its tip overlying the upper lumbar spine. Upper abdominal surgical clips. Bilateral shoulder degenerative changes, greater on the right. Diffuse osteopenia.  IMPRESSION: 1. Endotracheal tube tip in the right mainstem bronchus. It is recommended that this be retracted 4.5 cm. 2. Stable cardiomegaly and chronic interstitial lung disease. 3. Abnormally positioned possible left femoral vein catheter. This may be within a duplicated inferior vena cava and extending into the more right-sided inferior vena cava or not within a central vein.   Electronically Signed   By: Gordan Payment M.D.   On: 08/16/2014 21:40   Dg Abd Portable 1v  08/10/2014   CLINICAL DATA:  Code stroke.  Orogastric tube placement  EXAM: PORTABLE ABDOMEN - 1 VIEW  COMPARISON:  None.  FINDINGS: An orogastric tube is coiled in the stomach, with tip terminating in the region of the body or antrum. Multiple wires and leads overlap the abdomen. Pelvic thermistor present. No evidence for bowel obstruction. Cholecystectomy changes.  IMPRESSION: Orogastric tube is coiled in the stomach, with tip in the body/antrum.   Electronically Signed   By: Tiburcio Pea M.D.   On: 08/10/2014 21:37   Mr Maxine Glenn Head/brain Wo Cm  08/07/2014   CLINICAL DATA:  Subsequent encounter. Presentation on 08/18/2014 as code stroke. Patient treated with IV tPA and arterial intervention. Left-sided weakness. Unresponsive.  EXAM: MRI HEAD WITHOUT CONTRAST  MRA HEAD WITHOUT CONTRAST  TECHNIQUE: Multiplanar, multiecho pulse sequences of the brain and surrounding structures were  obtained without intravenous contrast. Angiographic images of the head were obtained using MRA technique without contrast.  COMPARISON:  CT earlier same day.  Multiple previous examinations.  FINDINGS: MRI HEAD FINDINGS  Diffusion imaging shows acute infarction affecting the entire right temporal and occipital region, the right basal ganglia in the right posterior frontal and parietal region consistent with complete right MCA territory distribution infarction, with fetal origin of the right PCA. The brain shows swelling with 2 mm of right-to-left midline shift. There are low level petechial blood products in the region of the infarction. No frank hematoma. No other vascular distribution acute infarction. Remainder the brain shows only minimal chronic small vessel change of the hemispheric white matter. No hydrocephalus. No extra-axial collection.  MRA HEAD FINDINGS  Both internal carotid arteries are widely patent into the brain. The anterior and middle cerebral vessels show flow bilaterally, possibly with luxury perfusion evident in the right middle cerebral artery and posterior cerebral artery territory. No residual filling defect. No underlying vascular stenosis.  Both vertebral arteries are patent with the right being dominant. No basilar stenosis. Basilar artery terminates and superior cerebellar arteries bilaterally, with fetal origin of both posterior  cerebral arteries.  IMPRESSION: Acute infarction throughout the right middle cerebral artery territory and the right PCA territory (fetal origin right PCA). Mild swelling with petechial blood products but no frank hematoma. Right-to-left shift of 2 mm.  MR angiography shows wide patency of the vessels presently, with luxury perfusion in the right MCA and PCA territories.   Electronically Signed   By: Paulina FusiMark  Shogry M.D.   On: 08/07/2014 13:42    Assessment/Plan: S/p Rt Common carotid arteriogram,followed by complete revascularization of occluded RT ICA  terminus and and RT MCA occlusion,and Rt ACAA1 and partially A2 segment using 9 mg of superselective IA Tpa and x 3 passes with Solitaire FR 4mm x 40 mm Stent retrieval device. MR findings noted, will report to Deveshwar.    LOS: 2 days    Brayton ElBRUNING, Glennis Borger PA-C 08/08/2014 9:55 AM

## 2014-08-08 NOTE — Progress Notes (Signed)
STROKE TEAM PROGRESS NOTE   HISTORY Ashni Lonzo is an 80 y.o. female with history of dementia presenting as code stroke for AMS and left sided symptoms concerning for a R MCA infarct. Per EMS patient was in normal state of health, at 1940 was in the shower and suddenly slumped over and became unresponsive. EMS noted right gaze preference and flaccid LUE. BS normal with BP 140/98 upon arrival. Upon arrival patient moans to noxious stimuli but otherwise non verbal. Per family and EMS notes patient has baseline dementia but can hold a conversation and is appropriately interactive. Called her husband to discuss findings and my concern for a right MCA infarct. Offered him option of IV tPA and possible IR treatment. Counseled on benefits and risks of the procedures. He expressed understanding and stated, "do what needs to be done". Discussed CT head results with radiology, Dr Constance Goltz, shows no ICH, no evidence of large acute infarct. Case discussed with IR Dr Corliss Skains, will proceed to IR after tPA. Patient intubated for airway protection. Received 10mg  labetalol prior to tPA with BP down to 180/84 prior to tPA admission.  Date last known well: 08/14/2014  Time last known well: 1940  tPA Given: yes, started 2107 for suspected right MCA infarct  NIHSS: 25 in ED  SUBJECTIVE (INTERVAL HISTORY) She had thrombectomy by IR 08/16/2014 pm and kept intubated. Repeat CT head after procedure showed left hemisphere swelling. Had MRI 08/07/14 showed large right MCA stroke with mass effect. Poor prognosis.Neurologically unchanged. Responsive but not following commands.  Talked with  Son from [place] briefly who requests full family meeting later with patient`s husband and daughter in addition. Schedule for later today  OBJECTIVE Temp:  [98.8 F (37.1 C)-100.9 F (38.3 C)] 100.4 F (38 C) (10/19 1300) Pulse Rate:  [53-78] 71 (10/19 1300) Cardiac Rhythm:  [-] Sinus bradycardia (10/19 1200) Resp:  [12-28] 24 (10/19  1300) BP: (84-138)/(29-80) 99/69 mmHg (10/19 1300) SpO2:  [100 %] 100 % (10/19 1300) Arterial Line BP: (52-176)/(36-84) 102/80 mmHg (10/19 1200) FiO2 (%):  [40 %] 40 % (10/19 1200)   Recent Labs Lab 08/07/14 2028 08/08/14 0010 08/08/14 0414 08/08/14 0831 08/08/14 1202  GLUCAP 112* 117* 119* 128* 107*    Recent Labs Lab 07/26/2014 2013 08/10/2014 2018 08/07/14 0355 08/08/14 0444  NA 139 142 141 141  K 3.8 3.7 3.4* 4.0  CL 100 102 107 109  CO2 28  --  22 21  GLUCOSE 125* 124* 167* 140*  BUN 17 16 14 9   CREATININE 0.88 0.90 0.67 0.61  CALCIUM 8.9  --  7.5* 7.6*    Recent Labs Lab 07/25/2014 2013 08/08/14 0444  AST 26 26  ALT 16 11  ALKPHOS 70 49  BILITOT 0.5 0.5  PROT 7.3 5.8*  ALBUMIN 3.4* 2.6*    Recent Labs Lab 08/09/2014 2013 08/09/2014 2018 08/07/14 0355  WBC 5.5  --  6.2  NEUTROABS 2.6  --  4.3  HGB 14.6 15.6* 12.3  HCT 42.7 46.0 36.0  MCV 89.7  --  88.7  PLT 129*  --  126*    Recent Labs Lab 08/07/14 1735 08/07/14 2121  TROPONINI <0.30 <0.30    Recent Labs  08/18/2014 2013  LABPROT 13.9  INR 1.06    Recent Labs  07/25/2014 2104  COLORURINE YELLOW  LABSPEC 1.012  PHURINE 7.5  GLUCOSEU NEGATIVE  HGBUR TRACE*  BILIRUBINUR NEGATIVE  KETONESUR 15*  PROTEINUR NEGATIVE  UROBILINOGEN 1.0  NITRITE NEGATIVE  LEUKOCYTESUR NEGATIVE  Component Value Date/Time   CHOL 117 08/07/2014 0356   TRIG 96 08/07/2014 0356   TRIG 95 08/07/2014 0356   HDL 31* 08/07/2014 0356   CHOLHDL 3.8 08/07/2014 0356   VLDL 19 08/07/2014 0356   LDLCALC 67 08/07/2014 0356   Lab Results  Component Value Date   HGBA1C 5.1 08/07/2014      Component Value Date/Time   LABOPIA NONE DETECTED 07/27/2014 2104   COCAINSCRNUR NONE DETECTED 07/25/2014 2104   LABBENZ NONE DETECTED 08/01/2014 2104   AMPHETMU NONE DETECTED 08/02/2014 2104   THCU NONE DETECTED 07/26/2014 2104   LABBARB NONE DETECTED 08/16/2014 2104     Recent Labs Lab 08/15/2014 2013  ETH <11     Ct Head Wo Contrast 08/07/2014   IMPRESSION: Interval change with findings suggesting developing large right middle cerebral artery infarct with mild local mass effect.  Minimal hyper density of the right base ganglia region and may represent result of recent contrast injection versus minimal petechial hemorrhage.  Patient is at risk for development of significant mass effect as this infarct progresses and close follow-up imaging recommended.     08/18/2014   IMPRESSION: No intracranial hemorrhage.  Small vessel disease type changes without CT evidence of large acute infarct. The slightly dense appearance of the M1 segment of the middle cerebral arteries appears symmetric and therefore not able to conclude that there is a thrombus in the M1 segment of the right middle cerebral artery.    Mr Brain Wo Contrast 08/07/2014   IMPRESSION: Acute infarction throughout the right middle cerebral artery territory and the right PCA territory (fetal origin right PCA). Mild swelling with petechial blood products but no frank hematoma. Right-to-left shift of 2 mm.  MR angiography shows wide patency of the vessels presently, with luxury perfusion in the right MCA and PCA territories.    Portable Chest Xray 08/07/2014   IMPRESSION: Endotracheal tube reposition now with the tip 4.2 cm above the carina.    Dg Chest Portable 1 View 07/31/2014   IMPRESSION: 1. Endotracheal tube tip in the right mainstem bronchus. It is recommended that this be retracted 4.5 cm. 2. Stable cardiomegaly and chronic interstitial lung disease. 3. Abnormally positioned possible left femoral vein catheter. This may be within a duplicated inferior vena cava and extending into the more right-sided inferior vena cava or not within a central vein.     Dg Abd Portable 1v 07/28/2014    IMPRESSION: Orogastric tube is coiled in the stomach, with tip in the body/antrum.      PHYSICAL EXAM  Temp:  [98.8 F (37.1 C)-100.9 F (38.3 C)] 100.4 F  (38 C) (10/19 1300) Pulse Rate:  [53-78] 71 (10/19 1300) Resp:  [12-28] 24 (10/19 1300) BP: (84-138)/(29-80) 99/69 mmHg (10/19 1300) SpO2:  [100 %] 100 % (10/19 1300) Arterial Line BP: (52-176)/(36-84) 102/80 mmHg (10/19 1200) FiO2 (%):  [40 %] 40 % (10/19 1200)  General - Well nourished, well developed, intubated on vent.Off propofol since this am   Respiratory : bilateral conducted sounds and few scattered rhonchi  Cardiovascular - Regular rate and rhythm with no murmur with occasional premature beat.  Neuro - intubated and sedated on vent,   she   spontaneously moving on the right, especially the arm with purposeful movement. PEERL, eyes deviated to right, Fundi not visualized.doll's eye present, corneal and gag present, no nystagmus, right facial droop obvious even with ET tapes. Left UE 0/5, left LE 2/5 on pain stimulation, RLE 3/5 on pain  stimulation, RUE purposeful movement. Left babinski positive.   ASSESSMENT/PLAN Ms. Maylon Cosnnette Wollen is a 80 y.o. female with history of dementia with behavior disturbance not able to do ADLs at baseline and heart murmur presenting with acute onset AMS and left sided weakness with right eye gaze. She received tPA and underwent IR with recannulization. However, post procedure CT and MRI showed large right MCA and PCA (fetal PCA) infarct with mass effect. Currently kept intubated.  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, embolic pattern, source unclear but suspect PAF    MRI  Large right MCA and PCA infarct with mass effect  MRA  luxury perfusion on the right hemisphere   Carotid Doppler  pending  2D Echo  pending  LDL 67, meet the goal < 70  HgbA1c pending  SCDs for VTE prophylaxis  NPO  Bedrest  no antithrombotics prior to admission, now on no thrombotics due to tPA  Resultant left hemiplegia, intubation, right gaze and AMS  Therapy recommendations:  pending  Disposition:  Pending  Dr Roda ShuttersXu Discussed with daughter  and son in law in [place]  On 08/07/14 and showed them images and informed them very poor prognosis. They would like to discuss with pt's husband and son before making any further decision. Currently still full code.  Plan family meeting today to discuss level of care and prognosis and life support  ? PAF - large embolic stroke in elderly - frequent PACs on tele - continue monitor  Hypertension  On admission 180/88  Home meds:   none  Intermittent hypotension  Permissive hypertension (OK if < 220/120) but gradually normalize in 5-7 days  Avoid hypotension  Hyperlipidemia  Home meds:  none   LDL 67  Meet the goal of < 70  Diabetes  HgbA1c pending goal < 7.0  CBG WNL  Controlled  Other Stroke Risk Factors Advanced age   Other Active Problems  Respiratory failure on vent  Other Pertinent History  Dementia on aricept  Agitation on sertraline  Hospital day # 2  This patient is critically ill due to large right hemisphere stroke, respiratory failure and at significant risk of neurological worsening, death form brain herniation and cerebral edema. This patient's care requires constant monitoring of vital signs, hemodynamics, respiratory and cardiac monitoring, review of multiple databases, neurological assessment, discussion with family, other specialists and medical decision making of high complexity. I spent 40 minutes of neurocritical care time in the care of this patient.   Delia HeadyPramod Sethi, MD Stroke Neurology 08/08/2014 2:05 PM   To contact Stroke Continuity provider, please refer to [email or web address]. After hours, contact General Neurology

## 2014-08-09 ENCOUNTER — Inpatient Hospital Stay (HOSPITAL_COMMUNITY): Payer: Medicare Other

## 2014-08-09 ENCOUNTER — Encounter (HOSPITAL_COMMUNITY): Payer: Self-pay | Admitting: Interventional Radiology

## 2014-08-09 DIAGNOSIS — F0391 Unspecified dementia with behavioral disturbance: Secondary | ICD-10-CM

## 2014-08-09 LAB — BASIC METABOLIC PANEL
ANION GAP: 10 (ref 5–15)
BUN: 18 mg/dL (ref 6–23)
CALCIUM: 8.3 mg/dL — AB (ref 8.4–10.5)
CO2: 27 meq/L (ref 19–32)
CREATININE: 0.75 mg/dL (ref 0.50–1.10)
Chloride: 108 mEq/L (ref 96–112)
GFR calc non Af Amer: 76 mL/min — ABNORMAL LOW (ref >90)
GFR, EST AFRICAN AMERICAN: 88 mL/min — AB (ref >90)
Glucose, Bld: 119 mg/dL — ABNORMAL HIGH (ref 70–99)
Potassium: 3.6 mEq/L — ABNORMAL LOW (ref 3.7–5.3)
SODIUM: 145 meq/L (ref 137–147)

## 2014-08-09 LAB — GLUCOSE, CAPILLARY
GLUCOSE-CAPILLARY: 148 mg/dL — AB (ref 70–99)
GLUCOSE-CAPILLARY: 149 mg/dL — AB (ref 70–99)
Glucose-Capillary: 119 mg/dL — ABNORMAL HIGH (ref 70–99)
Glucose-Capillary: 126 mg/dL — ABNORMAL HIGH (ref 70–99)
Glucose-Capillary: 137 mg/dL — ABNORMAL HIGH (ref 70–99)
Glucose-Capillary: 149 mg/dL — ABNORMAL HIGH (ref 70–99)

## 2014-08-09 MED ORDER — POTASSIUM CHLORIDE 20 MEQ/15ML (10%) PO LIQD
40.0000 meq | Freq: Once | ORAL | Status: AC
  Administered 2014-08-09: 40 meq
  Filled 2014-08-09: qty 30

## 2014-08-09 NOTE — Progress Notes (Signed)
08/08/14 1500  Clinical Encounter Type  Visited With Patient and family together  Visit Type Initial  Referral From Family  Spiritual Encounters  Spiritual Needs Ritual;Emotional  Stress Factors  Family Stress Factors Health changes;Loss of control;Major life changes   Chaplain was paged at roughly 3:30PM to the patient's room. The patient's family requested a chaplain assist in contacting a rabbi. Patient and family identify as Jewish. Chaplain contacted a rabbi and another chaplain followed up to ensure that this request was fulfilled. Chaplain will continue to provide emotional and spiritual support as needed. [place] NeighborStrother, Shammara Jarrett R, Chaplain 9:08 AM

## 2014-08-09 NOTE — Progress Notes (Signed)
[facility]  Called to bedside to speak to family re code status and withdrawal of care.  Family not here on my arrival.  Per nursing, family desires full DNR.  Chart reviewed including the patient's living will which states that she does wish to have life sustaining treatment withheld in the setting of a irreversible condition.  Considering her dementia and severe stroke, a DNR order fits this.  Will need to further clarify full comfort measures with family tomorrow, currently unclear if that is what they desire.  Palliative medicine consult placed per family request through nurse.  Heber CarolinaBrent Cristella Stiver, MD [facility] Pager: [phone] Cell: [phone] If no response, call [phone]

## 2014-08-09 NOTE — Progress Notes (Signed)
PULMONARY / CRITICAL CARE MEDICINE   Name: Monica [name] MRN: [id number] DOB: 1929-05-08    ADMISSION DATE:  05/04/14 CONSULTATION DATE:  10/18  REFERRING MD :  Stroke  INITIAL PRESENTATION:  72F code stroke went directly from ED to IR for intra-arterial tPA with good revascularization. Intubated for procedure. PCCM asked to assist with vent/ICU management  STUDIES/SIGNIFICANT EVENTS:  10/17 CT head: No intracranial hemorrhage. Small vessel disease type changes without CT evidence of large acute infarct. The slightly dense appearance of the M1 segment of the middle cerebral arteries appears symmetric and therefore not able to conclude that there is a thrombus in the M1 segment of the right middle cerebral artery 10/17 Neuro IR: R common carotid arteriogram, followed by complete revascularization of occluded R ICA terminus and and R MCA occlusion,and R ACAA1 and partially A2 segment using 9 mg of superselective IA tPA 10/18 CT head: Interval change with findings suggesting developing large right middle cerebral artery infarct with mild local mass effect 10/18 MRI/MRA brain: Acute infarction throughout the right middle cerebral artery territory and the right PCA territory (fetal origin right PCA). Mild swelling with petechial blood products but no frank hematoma. Right-to-left shift of 2 mm. MR angiography shows wide patency of the vessels presently 10/18 Carotid Duplex: no sig stenosis 10/18 TTE: nml LV fn, mild dilated LA  INDWELLING DEVICES:: ETT 10/17 >>  L radial A line 10/17 >>   MICRO DATA:   ANTIMICROBIALS:     SUBJECTIVE:  Low gr Febrile No obvious pain RASS +1  VITAL SIGNS: Temp:  [98.8 F (37.1 C)-101.3 F (38.5 C)] 99.3 F (37.4 C) (10/20 0800) Pulse Rate:  [51-82] 82 (10/20 0800) Resp:  [13-28] 25 (10/20 0800) BP: (87-141)/(36-87) 129/66 mmHg (10/20 0800) SpO2:  [100 %] 100 % (10/20 0800) Arterial Line BP: (75-165)/(61-80) 102/80 mmHg (10/19 1200) FiO2  (%):  [40 %] 40 % (10/20 0800) Weight:  [75.3 kg (166 lb 0.1 oz)] 75.3 kg (166 lb 0.1 oz) (10/20 0400) HEMODYNAMICS:   VENTILATOR SETTINGS: Vent Mode:  [-] PRVC FiO2 (%):  [40 %] 40 % Set Rate:  [14 bmp] 14 bmp Vt Set:  [500 mL] 500 mL PEEP:  [5 cmH20] 5 cmH20 Pressure Support:  [12 cmH20] 12 cmH20 Plateau Pressure:  [21 cmH20] 21 cmH20 INTAKE / OUTPUT:  Intake/Output Summary (Last 24 hours) at 08/09/14 0819 Last data filed at 08/09/14 0800  Gross per 24 hour  Intake 1323.75 ml  Output    585 ml  Net 738.75 ml    PHYSICAL EXAMINATION: General:  Elderly, intubated, RASS 0 Neuro:  Dense L hemiplegia, moves rt side spont, int follows commands HEENT: NCAT, PERRL Cardiovascular: reg, no M Lungs:  Clear Abdomen:  Soft, +BS Ext: warm, no edema  LABS: I have reviewed all of today's lab results. Relevant abnormalities are discussed in the A/P section  CXR: lt basal atx  ASSESSMENT / PLAN:  PULMONARY A: Acute resp failure due to AMS after large CVA P:   Cont vent bundle Daily SBT if/when meets criteria  CARDIOVASCULAR A: Int sinus brady P:  Monitor   RENAL A:  Oliguria Mild hypokalemia P:   Monitor BMET intermittently Monitor I/Os Correct electrolytes as indicated  GASTROINTESTINAL A:  No issues P:   SUP: IV PPI ct TFs 10/18  HEMATOLOGIC A:   No issues P:  DVT px: SCDs Monitor CBC intermittently Transfuse per usual ICU guidelines  INFECTIOUS A:   fever P:   Favor non infectious  cause, monitor WC  ENDOCRINE A:   DM 2 P:   SSI   NEUROLOGIC A:   Large Rt MCA/PCA CVA Dementia Chronic use of BZDs, SSRI P:   RASS goal: 0 dc propofol (brady) Holding alprazolam, sertraline    Family updated: 10/19 by stroke service   Interdisciplinary Family Meeting:    TODAY'S SUMMARY: - Dense left hemiplegia , guarded prognosis- Favor one way extubation here when family ready  The patient is critically ill with multiple organ systems failure  and requires high complexity decision making for assessment and support, frequent evaluation and titration of therapies, application of advanced monitoring technologies and extensive interpretation of multiple databases. Critical Care Time devoted to patient care services described in this note is 32 minutes.   Cyril Mourningakesh Ibeth Fahmy MD. Tonny BollmanFCCP.  Pulmonary & Critical care Pager [phone] If no response call [phone]      08/09/2014, 8:19 AM

## 2014-08-09 NOTE — Progress Notes (Signed)
PT Cancellation Note  Patient Details Name: Monica [name] MRN: [id number] DOB: Jul 16, 1929   Cancelled Treatment:    Reason Eval/Treat Not Completed: Patient not medically ready (remains on ventilator)   Fabio AsaWerner, Khalila Buechner J 08/09/2014, 9:27 AM Charlotte Crumbevon Shafiq Larch, PT DPT  [phone]

## 2014-08-09 NOTE — Progress Notes (Signed)
STROKE TEAM PROGRESS NOTE   HISTORY Monica [name] is an 80 y.o. female with history of dementia presenting as code stroke for AMS and left sided symptoms concerning for a R MCA infarct. Per EMS patient was in normal state of health, at 1940 was in the shower and suddenly slumped over and became unresponsive. EMS noted right gaze preference and flaccid LUE. BS normal with BP 140/98 upon arrival. Upon arrival patient moans to noxious stimuli but otherwise non verbal. Per family and EMS notes patient has baseline dementia but can hold a conversation and is appropriately interactive. Called her husband to discuss findings and my concern for a right MCA infarct. Offered him option of IV tPA and possible IR treatment. Counseled on benefits and risks of the procedures. He expressed understanding and stated, "do what needs to be done". Discussed CT head results with radiology, Dr Constance Goltz, shows no ICH, no evidence of large acute infarct. Case discussed with IR Dr Corliss Skains, [name] proceed to IR after tPA. Patient intubated for airway protection. Received 10mg  labetalol prior to tPA with BP down to 180/84 prior to tPA admission.  Date last known well: 08/08/2014  Time last known well: 1940  tPA Given: yes, started 2107 for suspected right MCA infarct  NIHSS: 25 in ED  SUBJECTIVE (INTERVAL HISTORY) She had thrombectomy by IR 08/07/2014 pm and kept intubated. Repeat CT head after procedure showed left hemisphere swelling. Had MRI 08/07/14 showed large right MCA stroke with mass effect. Poor prognosis.Neurologically unchanged. Responsive but not following commands.  Talked with son,daughter and husband at meeting 08/08/14 Family understands she [name] survive but with major diasability and are thinking about their decision wether to make her DNR or maintain full life support trach/peg/snf if needed  OBJECTIVE Temp:  [98.8 F (37.1 C)-101.3 F (38.5 C)] 100.8 F (38.2 C) (10/20 1400) Pulse Rate:  [51-82] 75 (10/20  1400) Cardiac Rhythm:  [-] Sinus bradycardia (10/20 1200) Resp:  [13-26] 25 (10/20 1400) BP: (87-141)/(36-87) 141/55 mmHg (10/20 1400) SpO2:  [100 %] 100 % (10/20 1400) FiO2 (%):  [40 %] 40 % (10/20 1236) Weight:  [166 lb 0.1 oz (75.3 kg)] 166 lb 0.1 oz (75.3 kg) (10/20 0400)   Recent Labs Lab 08/08/14 2025 08/08/14 2355 08/09/14 0338 08/09/14 0809 08/09/14 1150  GLUCAP 127* 134* 119* 149* 148*    Recent Labs Lab 07/30/2014 2013 07/23/2014 2018 08/07/14 0355 08/08/14 0444 08/09/14 0212  NA 139 142 141 141 145  K 3.8 3.7 3.4* 4.0 3.6*  CL 100 102 107 109 108  CO2 28  --  22 21 27   GLUCOSE 125* 124* 167* 140* 119*  BUN 17 16 14 9 18   CREATININE 0.88 0.90 0.67 0.61 0.75  CALCIUM 8.9  --  7.5* 7.6* 8.3*    Recent Labs Lab 07/31/2014 2013 08/08/14 0444  AST 26 26  ALT 16 11  ALKPHOS 70 49  BILITOT 0.5 0.5  PROT 7.3 5.8*  ALBUMIN 3.4* 2.6*    Recent Labs Lab 08/19/2014 2013 08/09/2014 2018 08/07/14 0355  WBC 5.5  --  6.2  NEUTROABS 2.6  --  4.3  HGB 14.6 15.6* 12.3  HCT 42.7 46.0 36.0  MCV 89.7  --  88.7  PLT 129*  --  126*    Recent Labs Lab 08/07/14 1735 08/07/14 2121  TROPONINI <0.30 <0.30    Recent Labs  08/09/2014 2013  LABPROT 13.9  INR 1.06    Recent Labs  08/02/2014 2104  COLORURINE YELLOW  LABSPEC  1.012  PHURINE 7.5  GLUCOSEU NEGATIVE  HGBUR TRACE*  BILIRUBINUR NEGATIVE  KETONESUR 15*  PROTEINUR NEGATIVE  UROBILINOGEN 1.0  NITRITE NEGATIVE  LEUKOCYTESUR NEGATIVE       Component Value Date/Time   CHOL 117 08/07/2014 0356   TRIG 96 08/07/2014 0356   TRIG 95 08/07/2014 0356   HDL 31* 08/07/2014 0356   CHOLHDL 3.8 08/07/2014 0356   VLDL 19 08/07/2014 0356   LDLCALC 67 08/07/2014 0356   Lab Results  Component Value Date   HGBA1C 5.1 08/07/2014      Component Value Date/Time   LABOPIA NONE DETECTED 2014/08/22 2104   COCAINSCRNUR NONE DETECTED 2014/08/22 2104   LABBENZ NONE DETECTED 2014/08/22 2104   AMPHETMU NONE DETECTED  2014/08/22 2104   THCU NONE DETECTED 2014/08/22 2104   LABBARB NONE DETECTED 2014/08/22 2104     Recent Labs Lab 2014/05/20 2013  ETH <11    Ct Head Wo Contrast 08/07/2014   IMPRESSION: Interval change with findings suggesting developing large right middle cerebral artery infarct with mild local mass effect.  Minimal hyper density of the right base ganglia region and may represent result of recent contrast injection versus minimal petechial hemorrhage.  Patient is at risk for development of significant mass effect as this infarct progresses and close follow-up imaging recommended.     2014/08/22   IMPRESSION: No intracranial hemorrhage.  Small vessel disease type changes without CT evidence of large acute infarct. The slightly dense appearance of the M1 segment of the middle cerebral arteries appears symmetric and therefore not able to conclude that there is a thrombus in the M1 segment of the right middle cerebral artery.    Mr Brain Wo Contrast 08/07/2014   IMPRESSION: Acute infarction throughout the right middle cerebral artery territory and the right PCA territory (fetal origin right PCA). Mild swelling with petechial blood products but no frank hematoma. Right-to-left shift of 2 mm.  MR angiography shows wide patency of the vessels presently, with luxury perfusion in the right MCA and PCA territories.    Portable Chest Xray 08/07/2014   IMPRESSION: Endotracheal tube reposition now with the tip 4.2 cm above the carina.    Dg Chest Portable 1 View 2014/08/22   IMPRESSION: 1. Endotracheal tube tip in the right mainstem bronchus. It is recommended that this be retracted 4.5 cm. 2. Stable cardiomegaly and chronic interstitial lung disease. 3. Abnormally positioned possible left femoral vein catheter. This may be within a duplicated inferior vena cava and extending into the more right-sided inferior vena cava or not within a central vein.     Dg Abd Portable 1v 2014/08/22    IMPRESSION:  Orogastric tube is coiled in the stomach, with tip in the body/antrum.      PHYSICAL EXAM  Temp:  [98.8 F (37.1 C)-101.3 F (38.5 C)] 100.8 F (38.2 C) (10/20 1400) Pulse Rate:  [51-82] 75 (10/20 1400) Resp:  [13-26] 25 (10/20 1400) BP: (87-141)/(36-87) 141/55 mmHg (10/20 1400) SpO2:  [100 %] 100 % (10/20 1400) FiO2 (%):  [40 %] 40 % (10/20 1236) Weight:  [166 lb 0.1 oz (75.3 kg)] 166 lb 0.1 oz (75.3 kg) (10/20 0400)  General - Well nourished, well developed, intubated on vent.    Respiratory : bilateral conducted sounds and few scattered rhonchi  Cardiovascular - Regular rate and rhythm with no murmur with occasional premature beat.  Neuro - intubated and  on vent,   she  Is awake and interactive and follows midline and right body  commands. spontaneously moving on the right, especially the arm with purposeful movement. PEERL, eyes deviated to right, Fundi not visualized.doll's eye present, corneal and gag present, no nystagmus, right facial droop obvious even with ET tapes. Left UE 0/5, left LE 2/5 on pain stimulation, RLE 3/5 on pain stimulation, RUE purposeful movement. Left babinski positive.   ASSESSMENT/PLAN Monica [name] is a 80 y.o. female with history of dementia with behavior disturbance not able to do ADLs at baseline and heart murmur presenting with acute onset AMS and left sided weakness with right eye gaze. She received tPA and underwent IR with recannulization. However, post procedure CT and MRI showed large right MCA and PCA (fetal PCA) infarct with mass effect. Currently kept intubated.  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 Wall thickness was normal. Systolic function was  normal. The estimated ejection fraction was in the range of 60% to 65%.     LDL 67, meet the goal < 70  HgbA1c  5.1  SCDs for VTE prophylaxis  NPO  Bedrest  no antithrombotics prior to admission, now on no thrombotics due to tPA  Resultant left hemiplegia, intubation, right gaze and AMS  Therapy recommendations:  pending  Disposition:  Pending    Plan await family decision on level of care prior to extubation trial. ? PAF - large embolic stroke in elderly - frequent PACs on tele - continue monitor  Hypertension  On admission 180/88  Home meds:   none  Intermittent hypotension  Permissive hypertension (OK if < 220/120) but gradually normalize in 5-7 days  Avoid hypotension  Hyperlipidemia  Home meds:  none   LDL 67  Meet the goal of < 70  Diabetes  HgbA1c pending goal < 7.0  CBG WNL  Controlled  Other Stroke Risk Factors Advanced age   Other Active Problems  Respiratory failure on vent  Other Pertinent History  Dementia on aricept  Agitation on sertraline  Hospital day # 3  This patient is critically ill due to large right hemisphere stroke, respiratory failure and at significant risk of neurological worsening, death form brain herniation and cerebral edema. This patient's care requires constant monitoring of vital signs, hemodynamics, respiratory and cardiac monitoring, review of multiple databases, neurological assessment, discussion with family, other specialists and medical decision making of high complexity. I spent 30 minutes of neurocritical care time in the care of this patient.   Delia HeadyPramod Sethi, MD Stroke Neurology 08/09/2014 3:33 PM   To contact Stroke Continuity provider, please refer to [email or web address]. After hours, contact General Neurology

## 2014-08-09 NOTE — Progress Notes (Signed)
OT Cancellation Note  Patient Details Name: Maylon Cosnnette Gabrielle MRN: [id number] DOB: 08-Jun-1929   Cancelled Treatment:    Reason Eval/Treat Not Completed: Patient not medically ready Pt on vent, sedated. [facility] S[facility]icare LtdWARD,HILLARY Kaylena Pacifico, OTR/L  [phone] 08/09/2014 08/09/2014, 9:47 AM

## 2014-08-10 DIAGNOSIS — R0600 Dyspnea, unspecified: Secondary | ICD-10-CM

## 2014-08-10 DIAGNOSIS — Z515 Encounter for palliative care: Secondary | ICD-10-CM

## 2014-08-10 LAB — GLUCOSE, CAPILLARY
GLUCOSE-CAPILLARY: 111 mg/dL — AB (ref 70–99)
GLUCOSE-CAPILLARY: 162 mg/dL — AB (ref 70–99)
Glucose-Capillary: 166 mg/dL — ABNORMAL HIGH (ref 70–99)

## 2014-08-10 LAB — TRIGLYCERIDES: TRIGLYCERIDES: 78 mg/dL (ref <150)

## 2014-08-10 MED ORDER — MORPHINE BOLUS VIA INFUSION
2.0000 mg | INTRAVENOUS | Status: DC | PRN
  Filled 2014-08-10: qty 2

## 2014-08-10 MED ORDER — MORPHINE BOLUS VIA INFUSION
1.0000 mg | INTRAVENOUS | Status: DC | PRN
  Filled 2014-08-10: qty 2

## 2014-08-10 MED ORDER — DIAZEPAM 5 MG/ML IJ SOLN: 5.0000 mg | INTRAMUSCULAR | Status: DC | PRN

## 2014-08-10 MED ORDER — ATROPINE SULFATE 1 % OP SOLN
4.0000 [drp] | OPHTHALMIC | Status: DC
  Administered 2014-08-10 – 2014-08-11 (×4): 4 [drp] via SUBLINGUAL
  Filled 2014-08-10: qty 2

## 2014-08-10 MED ORDER — SCOPOLAMINE 1 MG/3DAYS TD PT72
1.0000 | MEDICATED_PATCH | TRANSDERMAL | Status: DC
  Administered 2014-08-10: 1.5 mg via TRANSDERMAL
  Filled 2014-08-10: qty 1

## 2014-08-10 MED ORDER — SODIUM CHLORIDE 0.9 % IV SOLN
2.0000 mg/h | INTRAVENOUS | Status: DC
  Administered 2014-08-10: 4 mg/h via INTRAVENOUS
  Administered 2014-08-10: 24 mg/h via INTRAVENOUS
  Administered 2014-08-11 (×2): 25 mg/h via INTRAVENOUS
  Filled 2014-08-10 (×4): qty 10

## 2014-08-10 MED ORDER — MORPHINE BOLUS VIA INFUSION
2.0000 mg | INTRAVENOUS | Status: DC | PRN
  Filled 2014-08-10: qty 4

## 2014-08-10 MED ORDER — MORPHINE SULFATE 2 MG/ML IJ SOLN
2.0000 mg | INTRAMUSCULAR | Status: DC | PRN
  Administered 2014-08-10: 2 mg via INTRAVENOUS
  Administered 2014-08-10: 4 mg via INTRAVENOUS
  Filled 2014-08-10: qty 1
  Filled 2014-08-10: qty 2

## 2014-08-10 MED ORDER — DIAZEPAM 5 MG/ML IJ SOLN
INTRAMUSCULAR | Status: AC
  Filled 2014-08-10: qty 2

## 2014-08-10 MED ORDER — BISACODYL 10 MG RE SUPP: 10.0000 mg | Freq: Every day | RECTAL | Status: DC | PRN

## 2014-08-10 MED ORDER — DIAZEPAM 5 MG/ML IJ SOLN
5.0000 mg | Freq: Once | INTRAMUSCULAR | Status: AC
  Administered 2014-08-10: 14:00:00 via INTRAVENOUS

## 2014-08-10 MED ORDER — FENTANYL CITRATE 0.05 MG/ML IJ SOLN: 100.0000 ug | Freq: Once | INTRAMUSCULAR | Status: DC

## 2014-08-10 MED ORDER — ATROPINE SULFATE 1 % OP SOLN
4.0000 [drp] | OPHTHALMIC | Status: DC | PRN
  Administered 2014-08-10: 4 [drp] via SUBLINGUAL
  Filled 2014-08-10 (×2): qty 2

## 2014-08-10 NOTE — Sedation Documentation (Addendum)
Groin Puncture time: 2200 Charted for Loma Senderandy Davis RN

## 2014-08-10 NOTE — Progress Notes (Signed)
UR completed.  Domenic Schoenberger, RN BSN [facility] CCM Trauma/Neuro ICU Case Manager [phone]  

## 2014-08-10 NOTE — Progress Notes (Signed)
Chaplain followed up with family. Chaplain made family aware of her services. Chaplain will be present as needed.  08/10/14 1100  Clinical Encounter Type  Visited With Family  Visit Type Follow-up  Referral From Nurse  Charmian MuffStamey, Issam Carlyon F, Chaplain 08/10/2014 11:03 AM

## 2014-08-10 NOTE — Progress Notes (Signed)
STROKE TEAM PROGRESS NOTE   HISTORY Monica [name] is an 80 y.o. female with history of dementia presenting as code stroke for AMS and left sided symptoms concerning for a R MCA infarct. Per EMS patient was in normal state of health, at 1940 was in the shower and suddenly slumped over and became unresponsive. EMS noted right gaze preference and flaccid LUE. BS normal with BP 140/98 upon arrival. Upon arrival patient moans to noxious stimuli but otherwise non verbal. Per family and EMS notes patient has baseline dementia but can hold a conversation and is appropriately interactive. Called her husband to discuss findings and my concern for a right MCA infarct. Offered him option of IV tPA and possible IR treatment. Counseled on benefits and risks of the procedures. He expressed understanding and stated, "do what needs to be done". Discussed CT head results with radiology, Dr Constance Goltzlson, shows no ICH, no evidence of large acute infarct. Case discussed with IR Dr Corliss Skainseveshwar, will proceed to IR after tPA. Patient intubated for airway protection. Received 10mg  labetalol prior to tPA with BP down to 180/84 prior to tPA admission.  Date last known well: 08/10/2014  Time last known well: 1940  tPA Given: yes, started 2107 for suspected right MCA infarct  NIHSS: 25 in ED  SUBJECTIVE (INTERVAL HISTORY) She had thrombectomy by IR 08/02/2014 pm and kept intubated. Repeat CT head after procedure showed left hemisphere swelling. Had MRI 08/07/14 showed large right MCA stroke with mass effect. Poor prognosis.Neurologically unchanged. Responsive but not following commands.  Talked with son,daughter and husband at meeting today Family understands she will survive but with major diasability and have decided on DNR and no reintubation but want to pursue medical care if she survives. They do not want her to suffer and are not ready for full comfort care yet. OBJECTIVE Temp:  [98.3 F (36.8 C)-101.6 F (38.7 C)] 98.9 F (37.2 C)  (10/21 1200) Pulse Rate:  [65-84] 65 (10/21 1115) Cardiac Rhythm:  [-] Normal sinus rhythm (10/21 1100) Resp:  [14-33] 18 (10/21 1115) BP: (103-156)/(39-100) 154/71 mmHg (10/21 1000) SpO2:  [99 %-100 %] 100 % (10/21 1115) FiO2 (%):  [40 %] 40 % (10/21 1115)   Recent Labs Lab 08/09/14 2032 08/09/14 2334 08/10/14 0337 08/10/14 0813 08/10/14 1218  GLUCAP 126* 137* 111* 162* 166*    Recent Labs Lab 08/20/2014 2013 08/10/2014 2018 08/07/14 0355 08/08/14 0444 08/09/14 0212  NA 139 142 141 141 145  K 3.8 3.7 3.4* 4.0 3.6*  CL 100 102 107 109 108  CO2 28  --  22 21 27   GLUCOSE 125* 124* 167* 140* 119*  BUN 17 16 14 9 18   CREATININE 0.88 0.90 0.67 0.61 0.75  CALCIUM 8.9  --  7.5* 7.6* 8.3*    Recent Labs Lab 07/30/2014 2013 08/08/14 0444  AST 26 26  ALT 16 11  ALKPHOS 70 49  BILITOT 0.5 0.5  PROT 7.3 5.8*  ALBUMIN 3.4* 2.6*    Recent Labs Lab 08/02/2014 2013 08/07/2014 2018 08/07/14 0355  WBC 5.5  --  6.2  NEUTROABS 2.6  --  4.3  HGB 14.6 15.6* 12.3  HCT 42.7 46.0 36.0  MCV 89.7  --  88.7  PLT 129*  --  126*    Recent Labs Lab 08/07/14 1735 08/07/14 2121  TROPONINI <0.30 <0.30   No results found for this basename: LABPROT, INR,  in the last 72 hours No results found for this basename: COLORURINE, APPERANCEUR, LABSPEC, PHURINE, GLUCOSEU, HGBUR,  Margarita MailBILIRUBINUR, KETONESUR, PROTEINUR, UROBILINOGEN, NITRITE, LEUKOCYTESUR,  in the last 72 hours     Component Value Date/Time   CHOL 117 08/07/2014 0356   TRIG 78 08/10/2014 0149   HDL 31* 08/07/2014 0356   CHOLHDL 3.8 08/07/2014 0356   VLDL 19 08/07/2014 0356   LDLCALC 67 08/07/2014 0356   Lab Results  Component Value Date   HGBA1C 5.1 08/07/2014      Component Value Date/Time   LABOPIA NONE DETECTED 07/23/2014 2104   COCAINSCRNUR NONE DETECTED 07/21/2014 2104   LABBENZ NONE DETECTED 08/01/2014 2104   AMPHETMU NONE DETECTED 08/10/2014 2104   THCU NONE DETECTED 07/27/2014 2104   LABBARB NONE DETECTED  08/18/2014 2104     Recent Labs Lab 08/16/2014 2013  ETH <11    Ct Head Wo Contrast 08/07/2014   IMPRESSION: Interval change with findings suggesting developing large right middle cerebral artery infarct with mild local mass effect.  Minimal hyper density of the right base ganglia region and may represent result of recent contrast injection versus minimal petechial hemorrhage.  Patient is at risk for development of significant mass effect as this infarct progresses and close follow-up imaging recommended.     08/05/2014   IMPRESSION: No intracranial hemorrhage.  Small vessel disease type changes without CT evidence of large acute infarct. The slightly dense appearance of the M1 segment of the middle cerebral arteries appears symmetric and therefore not able to conclude that there is a thrombus in the M1 segment of the right middle cerebral artery.    Mr Brain Wo Contrast 08/07/2014   IMPRESSION: Acute infarction throughout the right middle cerebral artery territory and the right PCA territory (fetal origin right PCA). Mild swelling with petechial blood products but no frank hematoma. Right-to-left shift of 2 mm.  MR angiography shows wide patency of the vessels presently, with luxury perfusion in the right MCA and PCA territories.    Portable Chest Xray 08/07/2014   IMPRESSION: Endotracheal tube reposition now with the tip 4.2 cm above the carina.    Dg Chest Portable 1 View 08/20/2014   IMPRESSION: 1. Endotracheal tube tip in the right mainstem bronchus. It is recommended that this be retracted 4.5 cm. 2. Stable cardiomegaly and chronic interstitial lung disease. 3. Abnormally positioned possible left femoral vein catheter. This may be within a duplicated inferior vena cava and extending into the more right-sided inferior vena cava or not within a central vein.     Dg Abd Portable 1v 07/27/2014    IMPRESSION: Orogastric tube is coiled in the stomach, with tip in the body/antrum.       PHYSICAL EXAM  Temp:  [98.3 F (36.8 C)-101.6 F (38.7 C)] 98.9 F (37.2 C) (10/21 1200) Pulse Rate:  [65-84] 65 (10/21 1115) Resp:  [14-33] 18 (10/21 1115) BP: (103-156)/(39-100) 154/71 mmHg (10/21 1000) SpO2:  [99 %-100 %] 100 % (10/21 1115) FiO2 (%):  [40 %] 40 % (10/21 1115)  General - Well nourished, well developed, intubated on vent.    Respiratory : bilateral conducted sounds and few scattered rhonchi  Cardiovascular - Regular rate and rhythm with no murmur with occasional premature beat.  Neuro - intubated and  on vent,   she  Is awake and interactive and follows midline and right body commands. spontaneously moving on the right, especially the arm with purposeful movement. PEERL, eyes deviated to right, Fundi not visualized.doll's eye present, corneal and gag present, no nystagmus, right facial droop obvious even with ET tapes. Left UE 0/5,  left LE 2/5 on pain stimulation, RLE 3/5 on pain stimulation, RUE purposeful movement. Left babinski positive.   ASSESSMENT/PLAN Ms. Abir Eroh is a 80 y.o. female with history of dementia with behavior disturbance not able to do ADLs at baseline and heart murmur presenting with acute onset AMS and left sided weakness with right eye gaze. She received tPA and underwent IR with recannulization. However, post procedure CT and MRI showed large right MCA and PCA (fetal PCA) infarct with mass effect. Currently kept intubated.  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 Wall thickness was normal. Systolic function was normal. The estimated ejection fraction was in the range of 60% to 65%.     LDL 67, meet the goal < 70  HgbA1c  5.1  SCDs for VTE  prophylaxis  NPO  Bedrest  no antithrombotics prior to admission, now on no thrombotics due to tPA  Resultant left hemiplegia, intubation, right gaze and AMS  Therapy recommendations:  pending  Disposition:  Pending    Plan   extubation trial today with DNR but medical support. ? PAF - large embolic stroke in elderly - frequent PACs on tele - continue monitor  Hypertension  On admission 180/88  Home meds:   none  Intermittent hypotension  Permissive hypertension (OK if < 220/120) but gradually normalize in 5-7 days  Avoid hypotension  Hyperlipidemia  Home meds:  none   LDL 67  Meet the goal of < 70  Diabetes  HgbA1c pending goal < 7.0  CBG WNL  Controlled  Other Stroke Risk Factors Advanced age   Other Active Problems  Respiratory failure on vent  Other Pertinent History  Dementia on aricept  Agitation on sertraline  Hospital day # 4  This patient is critically ill due to large right hemisphere stroke, respiratory failure and at significant risk of neurological worsening, death form brain herniation and cerebral edema. This patient's care requires constant monitoring of vital signs, hemodynamics, respiratory and cardiac monitoring, review of multiple databases, neurological assessment, discussion with family, other specialists and medical decision making of high complexity. I spent 40 minutes of neurocritical care time in the care of this patient.   Delia Heady, MD Stroke Neurology 08/10/2014 2:30 PM   To contact Stroke Continuity provider, please refer to [email or web address]. After hours, contact General Neurology

## 2014-08-10 NOTE — Progress Notes (Addendum)
I have met with Monica [name] family briefly after being consulted for support. I spoke with Monica [name], daughter Monica [name], and son Monica [name]. Monica [name] is struggling with their decision to extubate but tells me that she does not want her to suffer and wants her to be comfortable. We discussed these goals and how we can work as a team to help with these issues to the best of our ability. Monica [name] and Monica [name] seem comfortable and at peace with their decision and do not want to delay extubation. However, Monica [name] is awaiting a call from a particular Rabbi to help make sure they are following Torah law - she does not want to have any regrets and wants to make sure they proceed in a way her mother would wish. She says Monica [name] is a devout Jew and always followed Jewish tradition and Administrator. I offered emotional support and reminded Monica [name] that her mother has addressed and made these decisions in her Living Will so hopefully she can find some peace in this.   Monica [name] appears comfortable on pressure support with fentanyl on board. I would also have small dose valium available at extubation in case of anxiety/seizure. I would recommend morphine 1 mg IV prn for pain/dyspnea/tachypnea at extubation as well.  Monica Sill, NP Palliative Medicine Team Pager # [phone] (M-F 8a-5p) Team Phone # [phone] (Nights/Weekends)

## 2014-08-10 NOTE — Progress Notes (Signed)
Patient transferred from 203 M via bed with RN and Nurse tech. Patient non verbal with labored breathing. Skin is intact with ecchymosis on arms, hands cold to touch, feet cold with mottling. Patient came with right forearm IV running Morphine drip at 24 ml /hr. Family, none at bedside. Bed placed at lowest position, patient made comfortable in bed. Will continue to monitor patient.

## 2014-08-10 NOTE — Plan of Care (Signed)
Problem: Acute Treatment Outcomes Goal: Prognosis discussed with family/patient as appropriate Outcome: Completed/Met Date Met:  08/10/14 Prognosis and care plan discussed with family and Dr. Sethi and Dr. Alva.        

## 2014-08-10 NOTE — Progress Notes (Signed)
SLP Cancellation Note  Patient Details Name: Monica [name] MRN: [id number] DOB: July 19, 1929   Cancelled treatment:       Reason Eval/Treat Not Completed: Medical issues which prohibited therapy. Will sign off, please reorder if appropriate.    Braylyn Kalter, Riley NearingBonnie Caroline 08/10/2014, 7:24 AM

## 2014-08-10 NOTE — Progress Notes (Signed)
PT Cancellation Note  Patient Details Name: Monica [name] MRN: [id number] DOB: 4/Maylon Cos15/1930   Cancelled Treatment:    Reason Eval/Treat Not Completed: Patient not medically ready (remains on ventilator), will sign off at this time, please reorder if appropriate.   Fabio AsaWerner, Shiven Junious J 08/10/2014, 8:53 AM Charlotte Crumbevon Haevyn Ury, PT DPT  [phone]

## 2014-08-10 NOTE — Progress Notes (Signed)
Pt with loud respiration, terminal secretions. Maximized position, repeat anticholinergic gtts morphine gtt titrating, oral and NTS performed by RN and RT. Emotional support given to family.

## 2014-08-10 NOTE — Progress Notes (Signed)
OT Cancellation Note  Patient Details Name: Maylon Cosnnette Mauney MRN: [id number] DOB: [birth year]   Cancelled Treatment:    Reason Eval/Treat Not Completed: Other (comment) OT signing off due to medical status. [facility] Leonte Horrigan, OTR/L  [phone] 08/10/2014 08/10/2014, 9:10 AM

## 2014-08-10 NOTE — Procedures (Signed)
Extubation Procedure Note  Patient Details:   Name: Monica [name] DOB: [birth year] MRN: [id number]   Airway Documentation:     Evaluation  O2 sats: stable throughout Complications: No apparent complications Patient did tolerate procedure well. Bilateral Breath Sounds: Rhonchi Suctioning: Airway Yes  Pt extubated per MD order.  Pt placed on 2l [place].  RN at bedside giving meds for comfort. Will continue to monitor.  Closson, Terie PurserMorgan Arath Kaigler 08/10/2014, 1:28 PM

## 2014-08-10 NOTE — Progress Notes (Signed)
PULMONARY / CRITICAL CARE MEDICINE   Name: Monica [name] MRN: [id number] DOB: February 15, 1929    ADMISSION DATE:  07/23/2014 CONSULTATION DATE:  10/18  REFERRING MD :  Stroke  INITIAL PRESENTATION:  45F code stroke went directly from ED to IR for intra-arterial tPA with good revascularization. Intubated for procedure. PCCM asked to assist with vent/ICU management  STUDIES/SIGNIFICANT EVENTS:  10/17 CT head: No intracranial hemorrhage. Small vessel disease type changes without CT evidence of large acute infarct. The slightly dense appearance of the M1 segment of the middle cerebral arteries appears symmetric and therefore not able to conclude that there is a thrombus in the M1 segment of the right middle cerebral artery 10/17 Neuro IR: R common carotid arteriogram, followed by complete revascularization of occluded R ICA terminus and and R MCA occlusion,and R ACAA1 and partially A2 segment using 9 mg of superselective IA tPA 10/18 CT head: Interval change with findings suggesting developing large right middle cerebral artery infarct with mild local mass effect 10/18 MRI/MRA brain: Acute infarction throughout the right middle cerebral artery territory and the right PCA territory (fetal origin right PCA). Mild swelling with petechial blood products but no frank hematoma. Right-to-left shift of 2 mm. MR angiography shows wide patency of the vessels presently 10/18 Carotid Duplex: no sig stenosis 10/18 TTE: nml LV fn, mild dilated LA  INDWELLING DEVICES:: ETT 10/17 >>  L radial A line 10/17 >>   MICRO DATA:   ANTIMICROBIALS:     SUBJECTIVE:  afebrile No obvious pain RASS -1  VITAL SIGNS: Temp:  [98.3 F (36.8 C)-101.6 F (38.7 C)] 98.5 F (36.9 C) (10/21 0800) Pulse Rate:  [59-83] 77 (10/21 0800) Resp:  [14-33] 26 (10/21 0800) BP: (95-156)/(36-100) 138/70 mmHg (10/21 0900) SpO2:  [99 %-100 %] 100 % (10/21 0800) FiO2 (%):  [40 %] 40 % (10/21 0732) HEMODYNAMICS:   VENTILATOR  SETTINGS: Vent Mode:  [-] CPAP;PSV FiO2 (%):  [40 %] 40 % Set Rate:  [14 bmp] 14 bmp Vt Set:  [500 mL] 500 mL PEEP:  [5 cmH20] 5 cmH20 Pressure Support:  [12 cmH20] 12 cmH20 Plateau Pressure:  [21 cmH20-23 cmH20] 22 cmH20 INTAKE / OUTPUT:  Intake/Output Summary (Last 24 hours) at 08/10/14 0920 Last data filed at 08/10/14 0900  Gross per 24 hour  Intake   1110 ml  Output   1295 ml  Net   -185 ml    PHYSICAL EXAMINATION: General:  Elderly, intubated, RASS 0 Neuro:  Dense L hemiplegia, moves rt side spont, int follows commands, not to my exam HEENT: NCAT, PERRL Cardiovascular: reg, no M Lungs:  Clear Abdomen:  Soft, +BS Ext: warm, no edema  LABS: I have reviewed all of today's lab results. Relevant abnormalities are discussed in the A/P section  CXR: lt basal atx  ASSESSMENT / PLAN:  PULMONARY A: Acute resp failure due to AMS after large CVA P:   Cont vent bundle Daily SBT if/when meets criteria  CARDIOVASCULAR A: Int sinus brady P:  Monitor   RENAL A:  Oliguria -resolved Mild hypokalemia -resolved P:   Monitor BMET intermittently Monitor I/Os Correct electrolytes as indicated  GASTROINTESTINAL A:  No issues P:   SUP: IV PPI ct TFs 10/18  HEMATOLOGIC A:   No issues P:  DVT px: SCDs Monitor CBC intermittently Transfuse per usual ICU guidelines  INFECTIOUS A:   fever P:   Favor non infectious cause, monitor WC  ENDOCRINE A:   DM 2 P:   SSI  NEUROLOGIC A:   Large Rt MCA/PCA CVA Dementia Chronic use of BZDs, SSRI P:   RASS goal: 0 dc propofol (brady) Holding alprazolam, sertraline    Family updated: 10/20    Interdisciplinary Family Meeting: 10/20   TODAY'S SUMMARY: - Dense left hemiplegia , guarded prognosis- detailed discussion with family on 10/20. Favor one way extubation here when family ready, likely today. If resp distress, full comfort. Daughter requested palliative care input for emotional support   The patient  is critically ill with multiple organ systems failure and requires high complexity decision making for assessment and support, frequent evaluation and titration of therapies, application of advanced monitoring technologies and extensive interpretation of multiple databases. Critical Care Time devoted to patient care services described in this note is 32 minutes.   [name] MD. Tonny BollmanFCCP. [facility] Pager [phone] If no response call [phone]    08/10/2014, 9:20 AM

## 2014-08-10 NOTE — Consult Note (Signed)
Patient ZO:XWRUEAV:Monica [name]      DOB: April 26, 1929      WUJ:[id number]     [facility]    Consult Requested by: Dr. Vassie LollAlva     PCP: No PCP Per Patient Reason for Consultation: Emotional support    Phone Number:None  Assessment of patients Current state: Monica [name]' husband and son are at bedside. Monica [name] has been extubated with goal of comfort and minimize pain/suffering. I was paged for symptom control. When I arrived Mrs. Geurin' was cyanotic, dyspneic/tachypneic, extreme work of breathing, with her eyes wide with fear. With the help of nursing she received multiple doses of morphine and fentanyl with minimal response. She has an allergy to lorazepam listed but family was not aware of her reaction but do remember her taking this before. I gave her a dose of valium and monitored for any reaction but she seemed to have some relief with the dose and no rash or other reaction noted. Valium will be ordered as needed to achieve comfort. Morphine infusion was initiated at 4 mg/hr but she required quick titration to 10 mg/hr with myself and nursing at bedside. Monica [name]' finally seems to be getting some relief. Family understands that this medication while provided for her distress is still sedating. She has copious terminal secretions where I will add scopolamine and sublingual atropine drops.      Goals of Care: 1.  Code Status: DNR   2. Scope of Treatment: Family tells me that comfort is priority.   4. Disposition: Anticipate hospital death.    3. Symptom Management:   1. Anxiety/Agitation: Valium 5 mg every 4 hours prn.  2. Pain: Morphine infusion with bolus - titrate to comfort.  3. Bowel Regimen: Dulcolax supp daily prn.  4. Nausea/Vomiting: Ondansetron prn.  5. Terminal Secretions: Scopolamine patch. Atropine SL 4 drops every 4 hours.   4. Psychosocial: Emotional support provided family during very difficult situation.   5.  Spiritual: Family has been supported by [place].    Brief HPI: 80 yo female with dementia and acute right MCA infarct and received tpa per neurology. She has required mechanical ventilation and has had minimal improvement. With poor prognosis family has decided to extubate with DNR in place. PMH significant for dementia with Lewy bodies and behavioral disturbance, anxiety, hypertension, diabetes, back surgery.    ROS: Unable to assess - nonverbal.     PMH:  Past Medical History  Diagnosis Date  . Dementia with Lewy bodies   . Dementia, unspecified, with behavioral disturbance   . Diabetes   . HTN (hypertension)   . Anxiety   . Dementia with behavioral disturbance 04/15/2013     PSH: Past Surgical History  Procedure Laterality Date  . Back surgery    . Radiology with anesthesia N/A 07/28/2014    Procedure: RADIOLOGY WITH ANESTHESIA;  Surgeon: Oneal GroutSanjeev K Deveshwar, MD;  Location: [facility] OR;  Service: Radiology;  Laterality: N/A;   I have reviewed the FH and SH and  If appropriate update it with new information. Allergies  Allergen Reactions  . Asa [Aspirin] Other (See Comments)    Unknown; not listed on nursing home MAR  . Depakote [Divalproex Sodium] Other (See Comments)    Listed on MAR; unknown reason  . Lorazepam Other (See Comments)    Listed on MAR; unknown reason  . Risperidone And Related Other (See Comments)    Listed on MAR; unknown reason   Scheduled Meds: .  stroke: mapping our early stages of recovery book   Does not apply Once  . antiseptic oral rinse  7 mL Mouth Rinse QID  . chlorhexidine  15 mL Mouth Rinse BID  . clopidogrel  75 mg Oral Daily  . diazepam      . feeding supplement (PRO-STAT SUGAR FREE 64)  30 mL Per Tube BID  . feeding supplement (VITAL HIGH PROTEIN)  1,000 mL Per Tube Q24H  . fentaNYL  100 mcg Intravenous Once  . insulin aspart  0-15 Units Subcutaneous 6 times per day  . multivitamin  5 mL Per Tube Daily  . pantoprazole sodium  40 mg Per Tube  Q1200  . scopolamine  1 patch Transdermal Q72H   Continuous Infusions: . sodium chloride 10 mL/hr at 08/10/14 0800  . morphine 12 mg/hr (08/10/14 1428)   PRN Meds:.acetaminophen, acetaminophen, atropine, diazepam, fentaNYL, hydrALAZINE, metoprolol, morphine, ondansetron (ZOFRAN) IV, senna-docusate    BP 154/71  Pulse 65  Temp(Src) 98.9 F (37.2 C) (Axillary)  Resp 18  Ht 5\' 2"  (1.575 m)  Wt 75.3 kg (166 lb 0.1 oz)  BMI 30.36 kg/m2  SpO2 100%   PPS: 10%   Intake/Output Summary (Last 24 hours) at 08/10/14 1428 Last data filed at 08/10/14 1100  Gross per 24 hour  Intake   1120 ml  Output   1520 ml  Net   -400 ml   LBM: unknown  Physical Exam:  General: Distressed, cyanotic HEENT: [place]/AT, no JVD, moist mucous membranes Chest: Rhonchi throughout, extremely labored breathing CVS: Tachycardic per monitor Abdomen: Soft, NT, ND Ext: No edema, cool to touch, cyanotic Neuro: Awake, unable to follow commands, not tracking  Labs: CBC    Component Value Date/Time   WBC 6.2 08/07/2014 0355   WBC 7.0 04/15/2013 1328   RBC 4.06 08/07/2014 0355   RBC 4.75 04/15/2013 1328   HGB 12.3 08/07/2014 0355   HCT 36.0 08/07/2014 0355   PLT 126* 08/07/2014 0355   MCV 88.7 08/07/2014 0355   MCH 30.3 08/07/2014 0355   MCH 30.9 04/15/2013 1328   MCHC 34.2 08/07/2014 0355   MCHC 33.3 04/15/2013 1328   RDW 13.2 08/07/2014 0355   RDW 13.5 04/15/2013 1328   LYMPHSABS 1.0 08/07/2014 0355   LYMPHSABS 2.0 04/15/2013 1328   MONOABS 0.9 08/07/2014 0355   EOSABS 0.1 08/07/2014 0355   EOSABS 0.1 04/15/2013 1328   BASOSABS 0.0 08/07/2014 0355   BASOSABS 0.0 04/15/2013 1328    BMET    Component Value Date/Time   NA 145 08/09/2014 0212   NA 144 04/15/2013 1328   K 3.6* 08/09/2014 0212   CL 108 08/09/2014 0212   CO2 27 08/09/2014 0212   GLUCOSE 119* 08/09/2014 0212   GLUCOSE 89 04/15/2013 1328   BUN 18 08/09/2014 0212   BUN 13 04/15/2013 1328   CREATININE 0.75 08/09/2014 0212   CALCIUM 8.3*  08/09/2014 0212   GFRNONAA 76* 08/09/2014 0212   GFRAA 88* 08/09/2014 0212    CMP     Component Value Date/Time   NA 145 08/09/2014 0212   NA 144 04/15/2013 1328   K 3.6* 08/09/2014 0212   CL 108 08/09/2014 0212   CO2 27 08/09/2014 0212   GLUCOSE 119* 08/09/2014 0212   GLUCOSE 89 04/15/2013 1328   BUN 18 08/09/2014 0212   BUN 13 04/15/2013 1328   CREATININE 0.75 08/09/2014 0212   CALCIUM 8.3* 08/09/2014 0212   PROT 5.8* 08/08/2014 0444   PROT 6.8 04/15/2013 1328  ALBUMIN 2.6* 08/08/2014 0444   AST 26 08/08/2014 0444   ALT 11 08/08/2014 0444   ALKPHOS 49 08/08/2014 0444   BILITOT 0.5 08/08/2014 0444   GFRNONAA 76* 08/09/2014 0212   GFRAA 88* 08/09/2014 0212     Time In Time Out Total Time Spent with Patient Total Overall Time  1510 1430     Greater than 50%  of this time was spent counseling and coordinating care related to the above assessment and plan.  Yong Channel, NP Palliative Medicine Team Pager # [phone] (M-F 8a-5p) Team Phone # [phone] (Nights/Weekends)

## 2014-08-10 NOTE — Progress Notes (Signed)
Pt transferred to 6E (palliative bed). Notified son of move. No incident.

## 2014-08-10 NOTE — Plan of Care (Signed)
Problem: Acute Treatment Outcomes Goal: Airway maintained/protected Outcome: Not Applicable Date Met:  91/36/85 Planning for one-way extubation once family ready.

## 2014-08-12 NOTE — Discharge Summary (Signed)
Patient ID: Monica [name] MRN: [id number] DOB/AGE: 1928-12-25 80 y.o.  Admit date: 10/26/2013 Death date: 08/04/2014 at 05: 53 am  Admission Diagnoses: Altered mental status and left sided weakness  Cause of Death:   Large right middle and posterior cerebral artery embolic infarcts from right terminal ICA occlusion with cytotoxic edema and herniation treated with IV and IA TPA   and mechanical embolectomy using 3 PASSES WITH THE SALTER FR 4 X 40 MM STENT RETRIEVAL  DEVICE:  with full recanalization of  right internal carotid, right middle and anterior cerebral arteries but with persistent large right brain strokes and significant neurological deficits and respirator failure. Patient made DO NOT RESUSCITATE by family and comfort care  Pertinent Medical Diagnosis: Active Problems:   CVA (cerebral infarction)   Palliative care encounter   Dyspnea   Hospital Course: Monica [name] is an 80 y.o. female with history of dementia presenting as code stroke for AMS and left sided symptoms concerning for a R MCA infarct. Per EMS patient was in normal state of health, at 1940 was in the shower and suddenly slumped over and became unresponsive. EMS noted right gaze preference and flaccid LUE. BS normal with BP 140/98 upon arrival. Upon arrival patient moans to noxious stimuli but otherwise non verbal. Per family and EMS notes patient has baseline dementia but can hold a conversation and is appropriately interactive. Called her husband to discuss findings and my concern for a right MCA infarct. Offered him option of IV tPA and possible IR treatment. Counseled on benefits and risks of the procedures. He expressed understanding and stated, "do what needs to be done". Discussed CT head results with radiology, Dr Constance Goltzlson, shows no ICH, no evidence of large acute infarct. Case discussed with IR Dr Corliss Skainseveshwar, will proceed to IR after tPA. Patient intubated for airway protection. Received 10mg  labetalol prior to tPA  with BP down to 180/84 prior to tPA admission.  Date last known well: 10/26/2013  Time last known well: 1940  tPA Given: yes, started 2107 for suspected right MCA infarct  NIHSS: 25 in ED Patient was Intubated in the ICU after thrombectomy but clearly had persistent left hemiplegia, left gaze palsy and field loss. She was arousable and able to follow some commands but remains plegic on the left. She had continuous difficulties. MRI scan confirmed a large right MCA and PCA infarct. The family understood that the patient had a poor prognosis and would likely need prolonged life support with the need for tracheostomy and PEG tube for feeding. Patient had dementia with behavioral disturbance at baseline and required help with most activities of daily living. Family understood her prognosis to be poor and did not want her to have prolonged life support and feeding tube. Hence she was made DO NOT RESUSCITATE and extubated but did not have good ventilatory drive and was in distress and she was made comfort care and started on morphine drip. She was kept comfortable and passed away peacefully on 07/27/2014 at 055 3 AM. Ct Head Wo Contrast  08/07/2014 IMPRESSION: Interval change with findings suggesting developing large right middle cerebral artery infarct with mild local mass effect. Minimal hyper density of the right base ganglia region and may represent result of recent contrast injection versus minimal petechial hemorrhage. Patient is at risk for development of significant mass effect as this infarct progresses and close follow-up imaging recommended.  10/26/2013 IMPRESSION: No intracranial hemorrhage. Small vessel disease type changes without CT evidence of large acute infarct. The slightly  dense appearance of the M1 segment of the middle cerebral arteries appears symmetric and therefore not able to conclude that there is a thrombus in the M1 segment of the right middle cerebral artery.  Mr Brain Wo Contrast   08/07/2014 IMPRESSION: Acute infarction throughout the right middle cerebral artery territory and the right PCA territory (fetal origin right PCA). Mild swelling with petechial blood products but no frank hematoma. Right-to-left shift of 2 mm. MR angiography shows wide patency of the vessels presently, with luxury perfusion in the right MCA and PCA territories.     Signed: SETHI,PRAMOD 07/28/2014, 2:44 PM

## 2014-08-15 ENCOUNTER — Telehealth: Payer: Self-pay | Admitting: Neurology

## 2014-08-15 NOTE — Telephone Encounter (Signed)
Pat from [facility] [employer] calling to see if Dr. Frances FurbishAthar will sign patient's death certificate, please return call and advise.

## 2014-08-16 NOTE — Telephone Encounter (Signed)
Please call funeral home. Unfortunately, I cannot sign death certificate. Should be signed by physician who witnessed death or took care of her in the hospital. Please send form back as well.

## 2014-08-16 NOTE — Telephone Encounter (Signed)
[facility], relating that Dr. Frances Furbishathar will not sign form, but looking at chart, Dr. Pearlean BrownieSethi was the stroke MD working with pt (per note).  He could sign.  I did leave message regarding this.

## 2014-08-21 NOTE — Progress Notes (Signed)
Patient passed at 05:53, verified with RN Minna MerrittsKeji Odedere. Dr Noel ChristmasStewart Charles, MD on call notified. [facility] notified as well, see documentation. Hattie PerchJames Derrington, son of patient called, son wants patient to be taken to the morgue, Bed placement notified. Morphine drip stopped, wasted 80ml with RN Bernie CoveyKimberly Gengler.

## 2014-08-21 DEATH — deceased

## 2014-12-03 IMAGING — CT CT HEAD W/O CM
2 series · 16 of 30 positions shown, 20 images · non-contrast
Comparison: Brain MRI January 08, 2013

CLINICAL DATA: Altered mental status.  [HOSPITAL] patient.

EXAM:
CT HEAD WITHOUT CONTRAST
TECHNIQUE: Contiguous axial images were obtained from the base of the skull
through the vertex without contrast.

[Series 2: head w/o · axial · non-contrast · 0.45mm/px · z∈[+1562,+1682]mm · 13 of 29 slices shown, 17 images]
[im 3/29  brain]
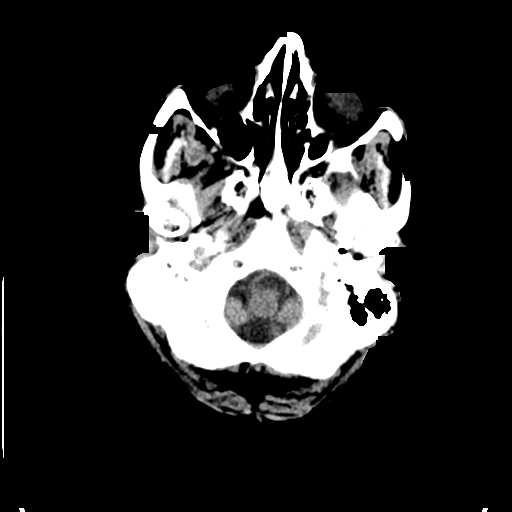
[im 3/29  bone]
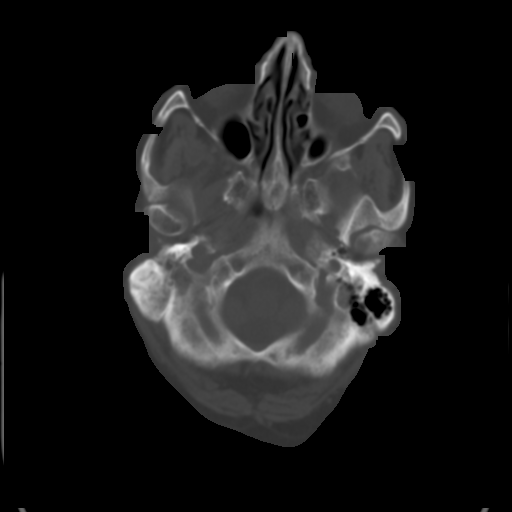
[im 5/29  brain]
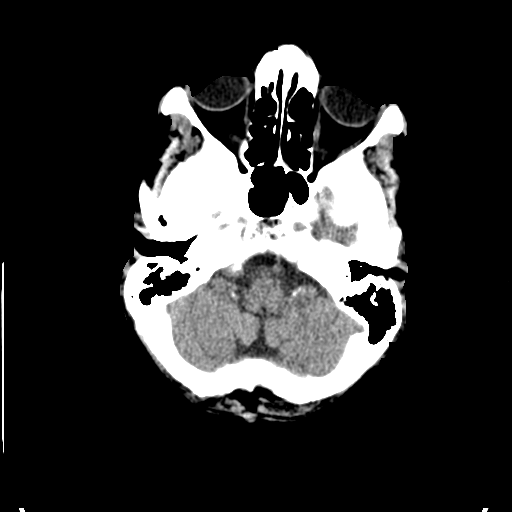
[im 7/29  brain]
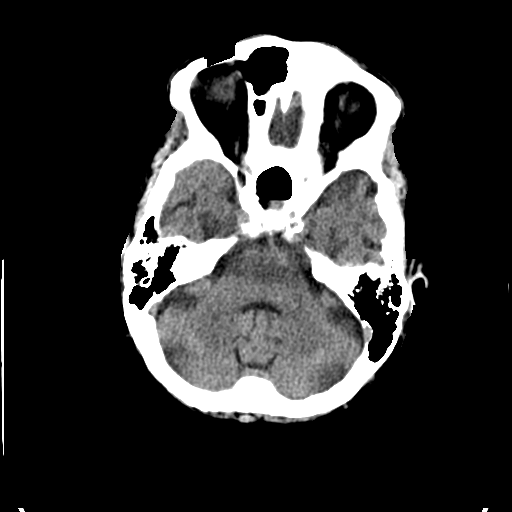
[im 9/29  brain]
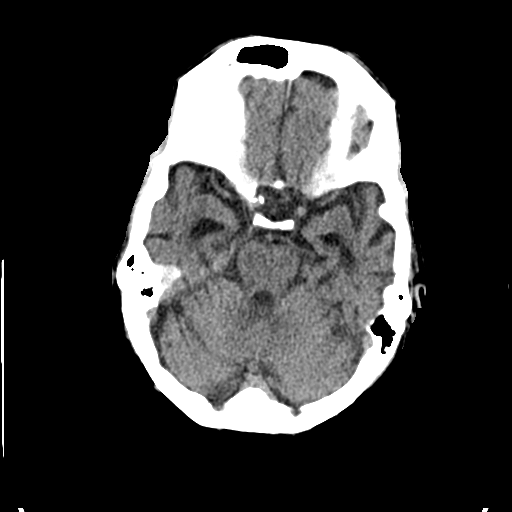
[im 11/29  brain]
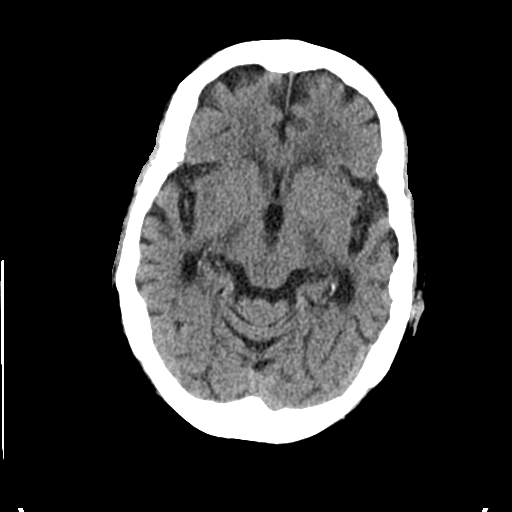
[im 11/29  bone]
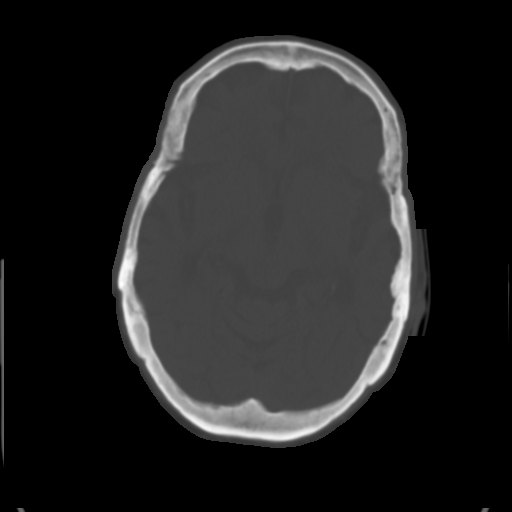
[im 13/29  brain]
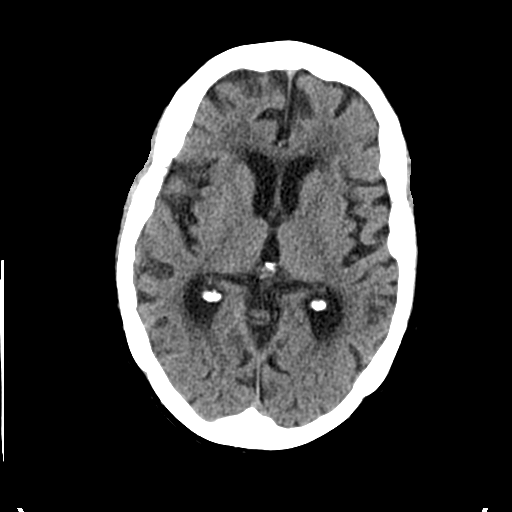
[im 15/29  brain]
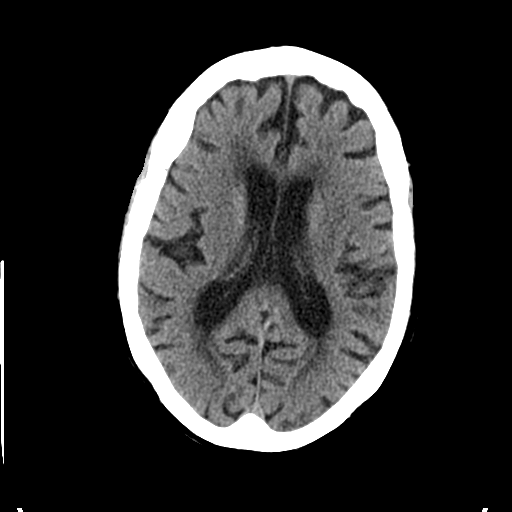
[im 17/29  brain]
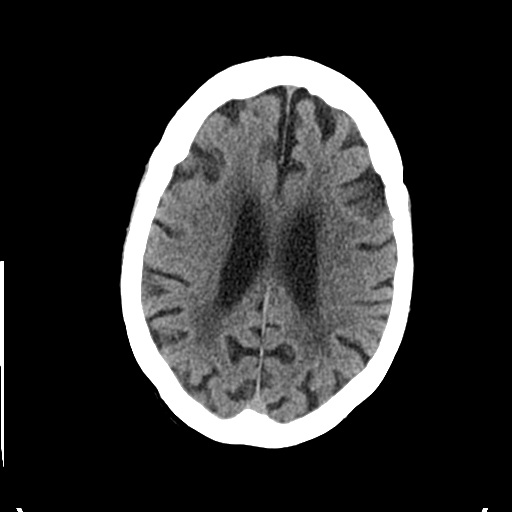
[im 19/29  brain]
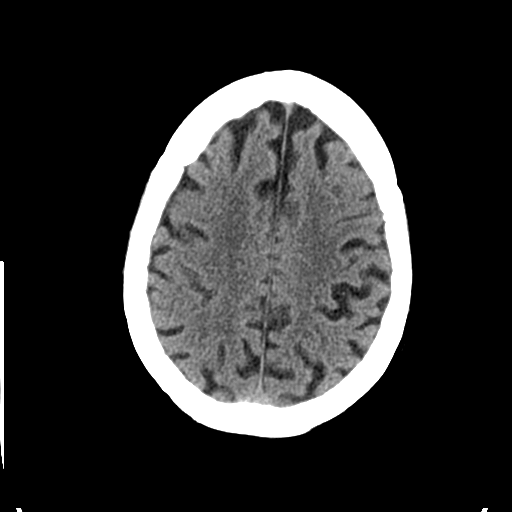
[im 19/29  bone]
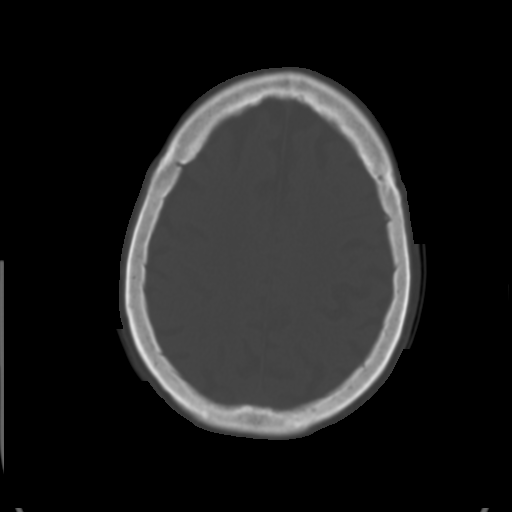
[im 21/29  brain]
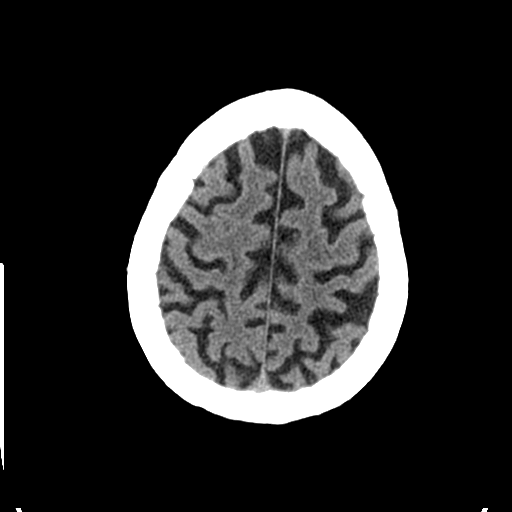
[im 23/29  brain]
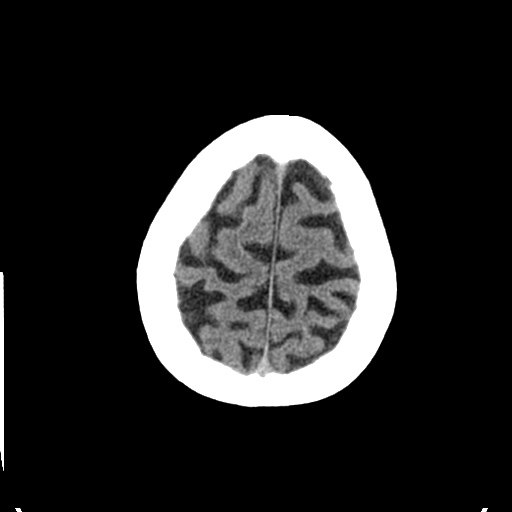
[im 25/29  brain]
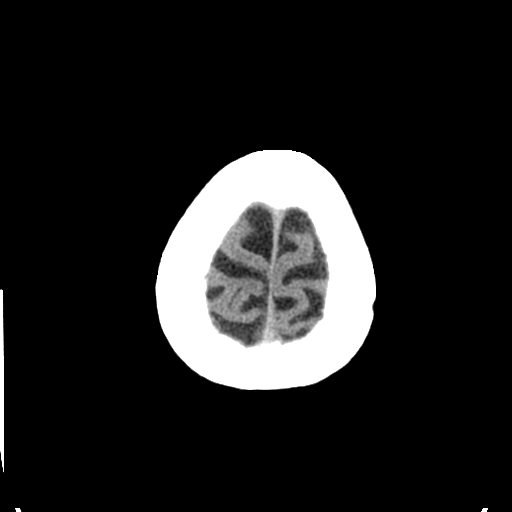
[im 27/29  brain]
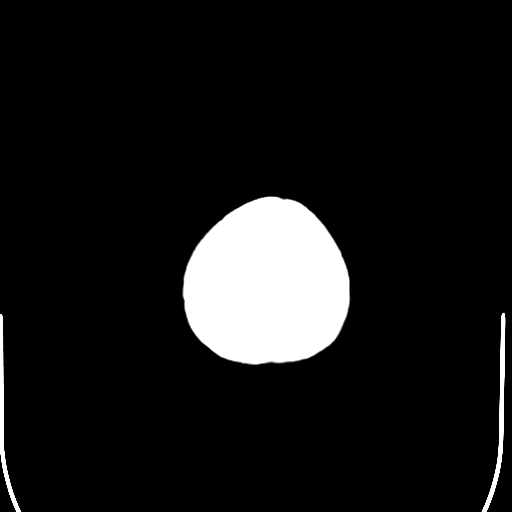
[im 27/29  bone]
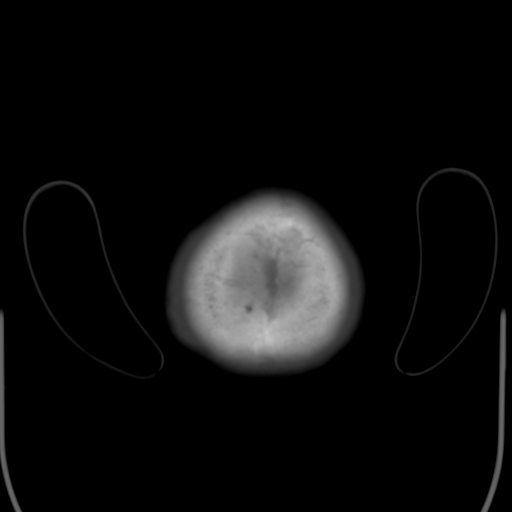

[Series 3: bone windows · axial · 0.45mm/px · z∈[+1562,+1602]mm · 3 of 29 slices shown]
[im 3/29  bone]
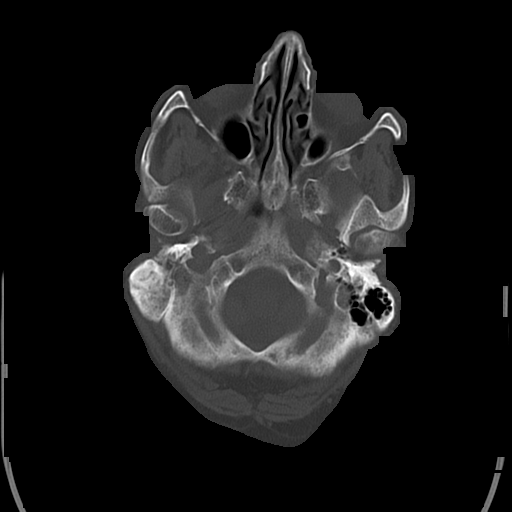
[im 7/29  bone]
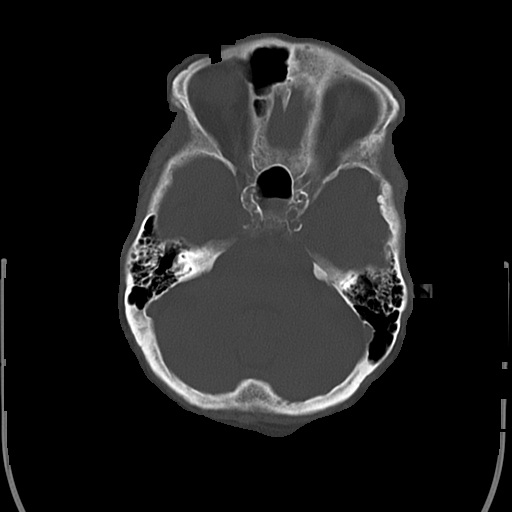
[im 11/29  bone]
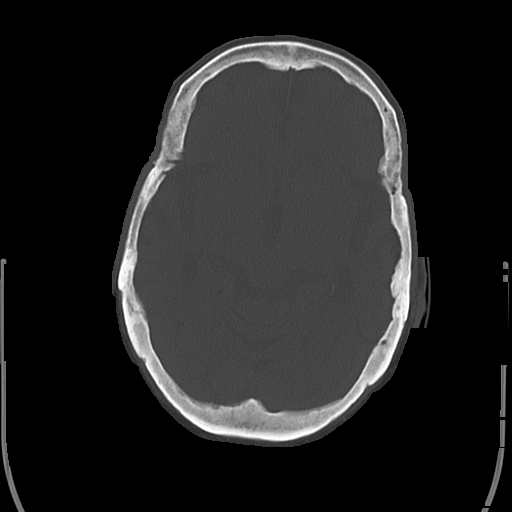

[16 of 30 positions shown; findings below may reference images not displayed]

FINDINGS: Generalized atrophy. Small vessel disease. No evidence for acute
infarction, hemorrhage, mass lesion, hydrocephalus, or extra-axial
fluid. Calvarium intact. Advanced vascular calcification. No acute
sinus, mastoid, or orbital abnormality.
IMPRESSION: No acute intracranial abnormality. Generalized atrophy and small
vessel disease similar to priors.

## 2014-12-03 IMAGING — CR DG CHEST 2V
2 series · 2 of 2 positions shown · non-contrast
Comparison: 07/08/2013

CLINICAL DATA: Altered mental status.  Hypertension.

EXAM:
CHEST  2 VIEW

[w chest lat]
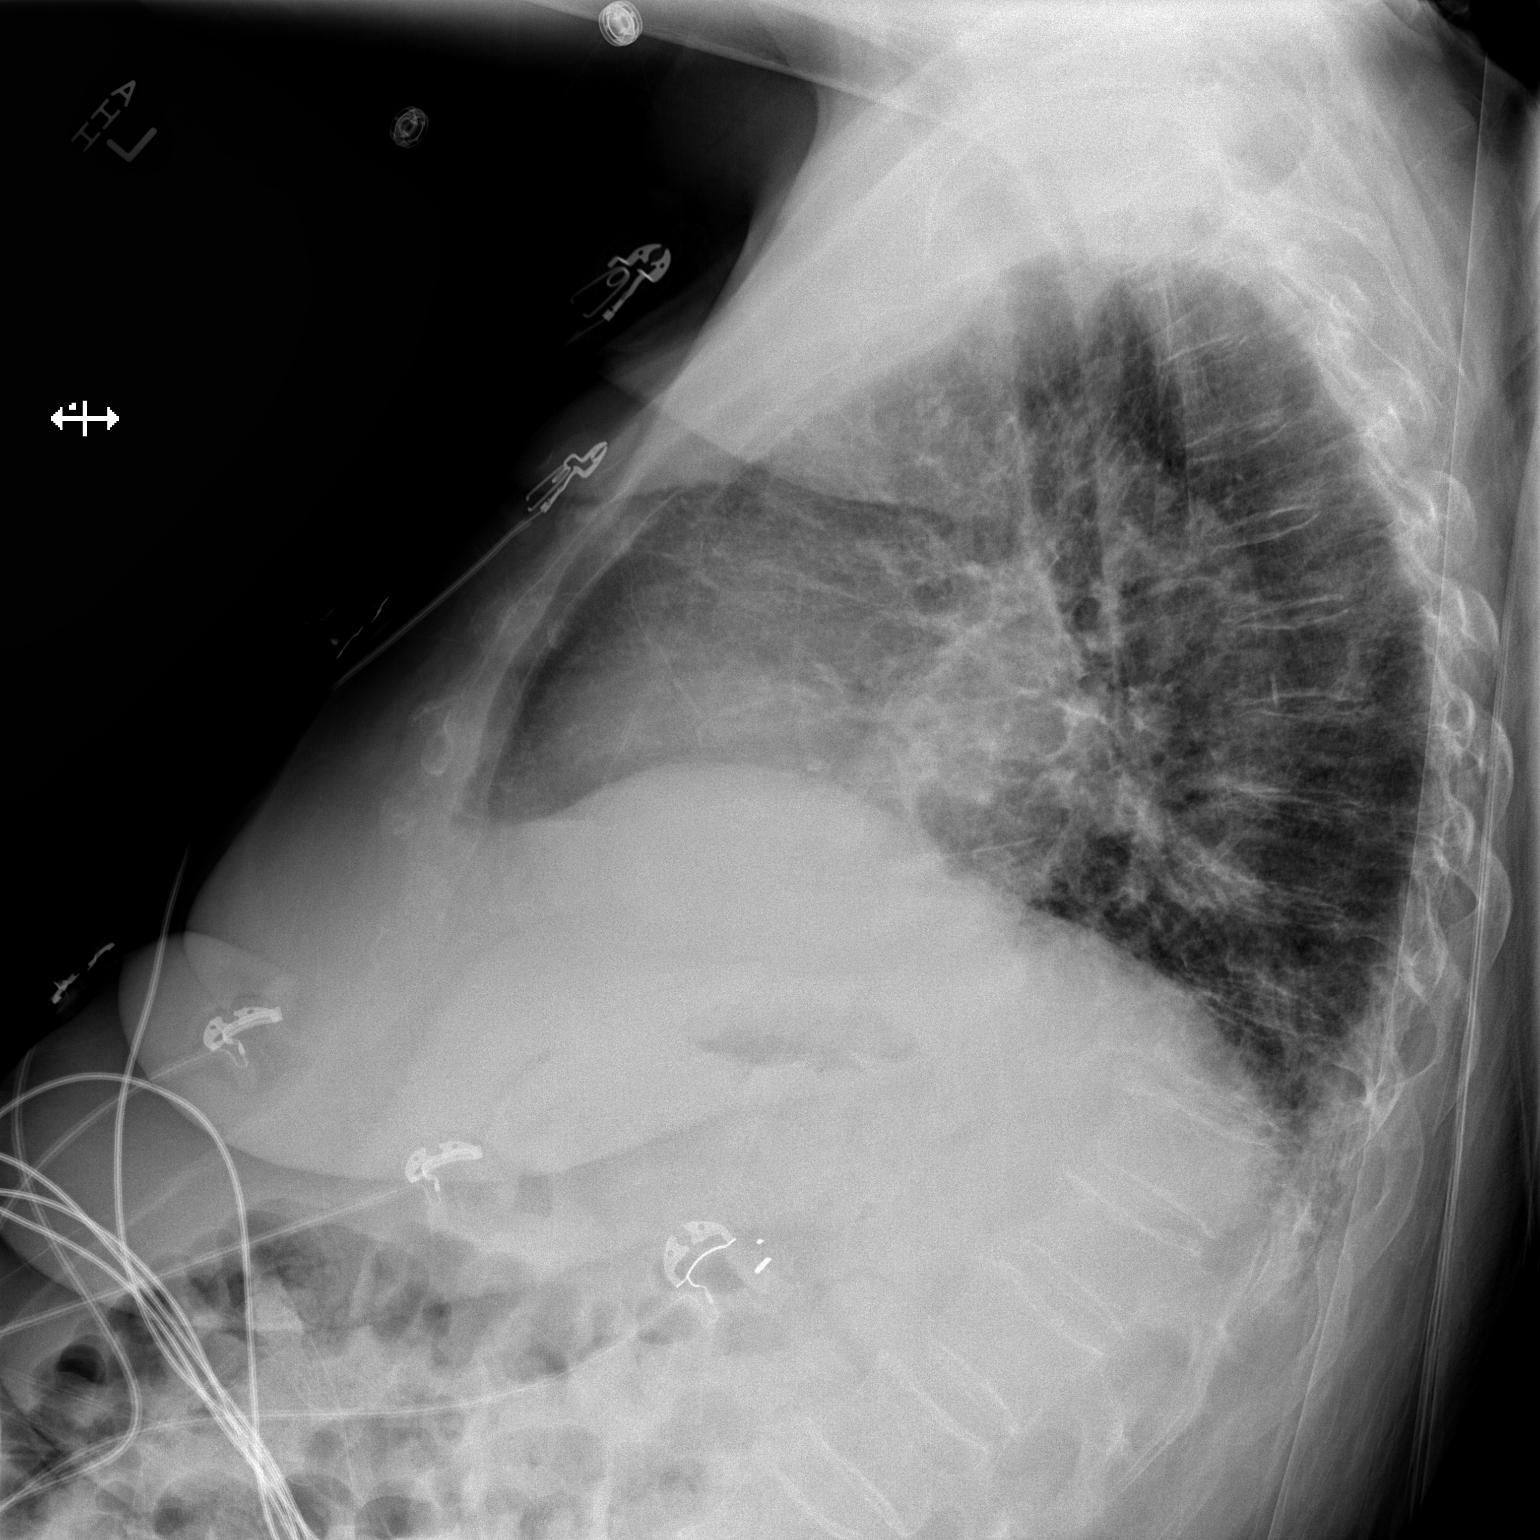

[x chest ap]
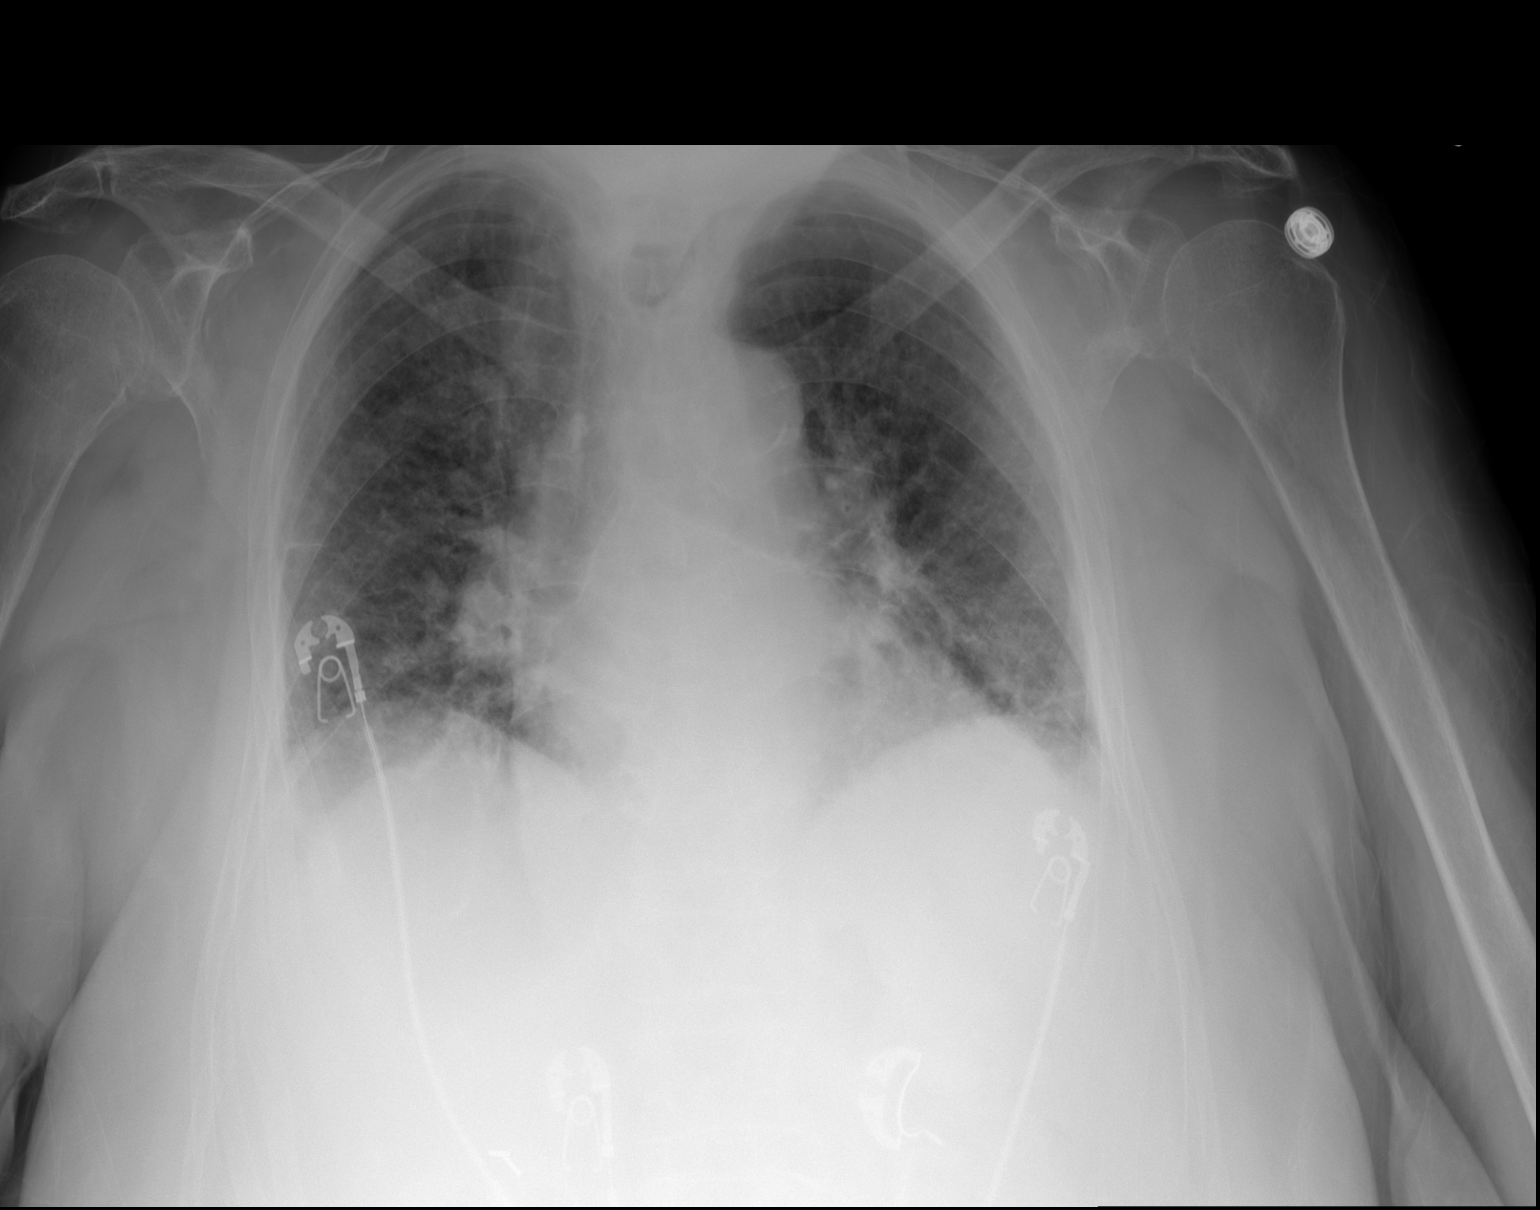

[2 of 2 positions shown; findings below may reference images not displayed]

FINDINGS: Cardiac silhouette is mildly enlarged. No mediastinal or hilar
masses. There is bilateral irregular interstitial thickening similar
to the prior exam allowing for differences in patient positioning
and technique. No lung consolidation or overt pulmonary edema. No
pleural effusion or pneumothorax.

Bony thorax is demineralized but grossly intact.
IMPRESSION: No acute cardiopulmonary disease.
